# Patient Record
Sex: Male | Born: 1980 | Hispanic: No | Marital: Single | Smoking: Current every day smoker
Health system: Southern US, Community
[De-identification: ages and names within clinical notes are randomized; demographics above are authoritative.]

## PROBLEM LIST (undated history)

## (undated) DIAGNOSIS — B20 Human immunodeficiency virus [HIV] disease: Secondary | ICD-10-CM

## (undated) DIAGNOSIS — F319 Bipolar disorder, unspecified: Secondary | ICD-10-CM

## (undated) DIAGNOSIS — F603 Borderline personality disorder: Secondary | ICD-10-CM

## (undated) DIAGNOSIS — J45909 Unspecified asthma, uncomplicated: Secondary | ICD-10-CM

## (undated) DIAGNOSIS — B1920 Unspecified viral hepatitis C without hepatic coma: Secondary | ICD-10-CM

## (undated) HISTORY — DX: Unspecified viral hepatitis C without hepatic coma: B19.20

## (undated) HISTORY — DX: Bipolar disorder, unspecified: F31.9

## (undated) HISTORY — PX: OTHER SURGICAL HISTORY: SHX169

## (undated) HISTORY — DX: Borderline personality disorder: F60.3

## (undated) HISTORY — DX: Unspecified asthma, uncomplicated: J45.909

---

## 1999-04-24 DIAGNOSIS — Z21 Asymptomatic human immunodeficiency virus [HIV] infection status: Secondary | ICD-10-CM | POA: Insufficient documentation

## 1999-04-24 DIAGNOSIS — B20 Human immunodeficiency virus [HIV] disease: Secondary | ICD-10-CM | POA: Insufficient documentation

## 2009-04-23 DIAGNOSIS — B20 Human immunodeficiency virus [HIV] disease: Secondary | ICD-10-CM | POA: Insufficient documentation

## 2019-01-29 DIAGNOSIS — Z720 Tobacco use: Secondary | ICD-10-CM | POA: Insufficient documentation

## 2019-09-24 DIAGNOSIS — R2231 Localized swelling, mass and lump, right upper limb: Secondary | ICD-10-CM | POA: Insufficient documentation

## 2019-10-12 DIAGNOSIS — M67441 Ganglion, right hand: Secondary | ICD-10-CM | POA: Insufficient documentation

## 2019-10-12 DIAGNOSIS — K219 Gastro-esophageal reflux disease without esophagitis: Secondary | ICD-10-CM | POA: Insufficient documentation

## 2020-12-29 ENCOUNTER — Ambulatory Visit: Payer: Self-pay

## 2020-12-29 ENCOUNTER — Other Ambulatory Visit: Payer: Self-pay

## 2020-12-29 DIAGNOSIS — Z113 Encounter for screening for infections with a predominantly sexual mode of transmission: Secondary | ICD-10-CM

## 2020-12-29 DIAGNOSIS — B20 Human immunodeficiency virus [HIV] disease: Secondary | ICD-10-CM

## 2020-12-29 DIAGNOSIS — Z79899 Other long term (current) drug therapy: Secondary | ICD-10-CM

## 2021-01-02 ENCOUNTER — Other Ambulatory Visit (HOSPITAL_COMMUNITY)
Admission: RE | Admit: 2021-01-02 | Discharge: 2021-01-02 | Disposition: A | Discharge status: Home / Self Care | Payer: Medicare Other | Source: Ambulatory Visit | Attending: Infectious Disease | Admitting: Infectious Disease

## 2021-01-02 ENCOUNTER — Other Ambulatory Visit: Payer: Medicare Other

## 2021-01-02 ENCOUNTER — Other Ambulatory Visit: Payer: Self-pay

## 2021-01-02 ENCOUNTER — Ambulatory Visit: Payer: Self-pay

## 2021-01-02 DIAGNOSIS — Z79899 Other long term (current) drug therapy: Secondary | ICD-10-CM

## 2021-01-02 DIAGNOSIS — Z113 Encounter for screening for infections with a predominantly sexual mode of transmission: Secondary | ICD-10-CM

## 2021-01-02 DIAGNOSIS — B20 Human immunodeficiency virus [HIV] disease: Secondary | ICD-10-CM | POA: Insufficient documentation

## 2021-01-03 LAB — URINALYSIS
Bilirubin Urine: NEGATIVE
Glucose, UA: NEGATIVE
Hgb urine dipstick: NEGATIVE
Ketones, ur: NEGATIVE
Leukocytes,Ua: NEGATIVE
Nitrite: NEGATIVE
Protein, ur: NEGATIVE
Specific Gravity, Urine: 1.022 (ref 1.001–1.035)
pH: 7.5 (ref 5.0–8.0)

## 2021-01-03 LAB — URINE CYTOLOGY ANCILLARY ONLY
Chlamydia: NEGATIVE
Comment: NEGATIVE
Comment: NORMAL
Neisseria Gonorrhea: NEGATIVE

## 2021-01-03 LAB — T-HELPER CELL (CD4) - (RCID CLINIC ONLY)
CD4 % Helper T Cell: 26 % — ABNORMAL LOW (ref 33–65)
CD4 T Cell Abs: 499 /uL (ref 400–1790)

## 2021-01-06 LAB — COMPLETE METABOLIC PANEL WITH GFR
AG Ratio: 1.5 (calc) (ref 1.0–2.5)
ALT: 15 U/L (ref 9–46)
AST: 21 U/L (ref 10–40)
Albumin: 4.4 g/dL (ref 3.6–5.1)
Alkaline phosphatase (APISO): 44 U/L (ref 36–130)
BUN/Creatinine Ratio: 12 (calc) (ref 6–22)
BUN: 16 mg/dL (ref 7–25)
CO2: 26 mmol/L (ref 20–32)
Calcium: 9.3 mg/dL (ref 8.6–10.3)
Chloride: 104 mmol/L (ref 98–110)
Creat: 1.32 mg/dL — ABNORMAL HIGH (ref 0.60–1.29)
Globulin: 2.9 g/dL (calc) (ref 1.9–3.7)
Glucose, Bld: 82 mg/dL (ref 65–99)
Potassium: 4.6 mmol/L (ref 3.5–5.3)
Sodium: 139 mmol/L (ref 135–146)
Total Bilirubin: 0.3 mg/dL (ref 0.2–1.2)
Total Protein: 7.3 g/dL (ref 6.1–8.1)
eGFR: 69 mL/min/{1.73_m2} (ref >=60)

## 2021-01-06 LAB — HEPATITIS B CORE ANTIBODY, TOTAL: Hep B Core Total Ab: NONREACTIVE

## 2021-01-06 LAB — CBC WITH DIFFERENTIAL/PLATELET
Absolute Monocytes: 571 cells/uL (ref 200–950)
Basophils Absolute: 48 cells/uL (ref 0–200)
Basophils Relative: 0.7 %
Eosinophils Absolute: 48 cells/uL (ref 15–500)
Eosinophils Relative: 0.7 %
HCT: 49.9 % (ref 38.5–50.0)
Hemoglobin: 16 g/dL (ref 13.2–17.1)
Lymphs Abs: 2190 cells/uL (ref 850–3900)
MCH: 27.3 pg (ref 27.0–33.0)
MCHC: 32.1 g/dL (ref 32.0–36.0)
MCV: 85.2 fL (ref 80.0–100.0)
MPV: 9.8 fL (ref 7.5–12.5)
Monocytes Relative: 8.4 %
Neutro Abs: 3944 cells/uL (ref 1500–7800)
Neutrophils Relative %: 58 %
Platelets: 317 10*3/uL (ref 140–400)
RBC: 5.86 10*6/uL — ABNORMAL HIGH (ref 4.20–5.80)
RDW: 13.1 % (ref 11.0–15.0)
Total Lymphocyte: 32.2 %
WBC: 6.8 10*3/uL (ref 3.8–10.8)

## 2021-01-06 LAB — HLA B*5701: HLA-B*5701 w/rflx HLA-B High: NEGATIVE

## 2021-01-06 LAB — LIPID PANEL
Cholesterol: 153 mg/dL (ref <200)
HDL: 48 mg/dL (ref >=40)
LDL Cholesterol (Calc): 91 mg/dL (calc)
Non-HDL Cholesterol (Calc): 105 mg/dL (calc) (ref <130)
Total CHOL/HDL Ratio: 3.2 (calc) (ref <5.0)
Triglycerides: 54 mg/dL (ref <150)

## 2021-01-06 LAB — HEPATITIS B SURFACE ANTIBODY,QUALITATIVE: Hep B S Ab: REACTIVE — AB

## 2021-01-06 LAB — HIV-1/2 AB - DIFFERENTIATION
HIV-1 antibody: POSITIVE — AB
HIV-2 Ab: NEGATIVE

## 2021-01-06 LAB — HEPATITIS A ANTIBODY, TOTAL: Hepatitis A AB,Total: NONREACTIVE

## 2021-01-06 LAB — HEPATITIS C ANTIBODY
Hepatitis C Ab: REACTIVE — AB
SIGNAL TO CUT-OFF: 15.4 — ABNORMAL HIGH (ref <1.00)

## 2021-01-06 LAB — QUANTIFERON-TB GOLD PLUS
Mitogen-NIL: >10 IU/mL
NIL: 0.07 IU/mL
QuantiFERON-TB Gold Plus: NEGATIVE
TB1-NIL: 0 IU/mL
TB2-NIL: <0 IU/mL

## 2021-01-06 LAB — HIV ANTIBODY (ROUTINE TESTING W REFLEX): HIV 1&2 Ab, 4th Generation: REACTIVE — AB

## 2021-01-06 LAB — HCV RNA,QUANTITATIVE REAL TIME PCR
HCV Quantitative Log: <1.18 Log IU/mL
HCV RNA, PCR, QN: <15 IU/mL

## 2021-01-06 LAB — HIV-1 RNA ULTRAQUANT REFLEX TO GENTYP+
HIV 1 RNA Quant: <20 copies/mL — AB
HIV-1 RNA Quant, Log: <1.3 Log copies/mL — AB

## 2021-01-06 LAB — RPR: RPR Ser Ql: NONREACTIVE

## 2021-01-06 LAB — HEPATITIS B SURFACE ANTIGEN: Hepatitis B Surface Ag: NONREACTIVE

## 2021-01-13 ENCOUNTER — Ambulatory Visit: Payer: Self-pay | Admitting: Pharmacist

## 2021-01-13 ENCOUNTER — Encounter: Payer: Self-pay | Admitting: Infectious Disease

## 2021-01-16 ENCOUNTER — Ambulatory Visit: Payer: Self-pay | Admitting: Pharmacist

## 2021-01-16 ENCOUNTER — Other Ambulatory Visit (HOSPITAL_COMMUNITY): Payer: Self-pay

## 2021-01-16 ENCOUNTER — Telehealth: Payer: Self-pay

## 2021-01-16 ENCOUNTER — Encounter: Payer: Self-pay | Admitting: Infectious Disease

## 2021-01-16 NOTE — Telephone Encounter (Signed)
[facility] Patient Teacher, English as a foreign language completed.    The patient is insured through [employer].  Phillips Odor was last filled 12/28/20 NF 01/20/21  Kern Reap is covered under pharmacy benefits prescription can be filled at [facility].  We will continue to follow to see if copay assistance is needed.  Ileene Patrick, [facility] Patient [facility] Phone: [phone] Fax:  [phone]

## 2021-01-24 ENCOUNTER — Encounter: Payer: Self-pay | Admitting: Infectious Disease

## 2021-01-24 ENCOUNTER — Ambulatory Visit: Payer: Self-pay | Admitting: Pharmacist

## 2021-01-26 NOTE — Progress Notes (Signed)
Patient ID: Justin Hodge, male   DOB: December 27, 1980, 40 y.o.   MRN: [id number] Patient [name] to reschedule his missed appointment on 01-24-21  he has no showed 2 other times  advised would need to reschedule with Pharmacy  he was coughing and sneezing said he felt nauseas  ask if he has had a covid test recently he advised he did not need one and he wanted a Freight forwarder  tried to transfer to [place] and got her voice mail

## 2021-01-30 ENCOUNTER — Encounter: Payer: Self-pay | Admitting: Family

## 2021-01-30 ENCOUNTER — Other Ambulatory Visit: Payer: Self-pay

## 2021-01-30 ENCOUNTER — Ambulatory Visit (INDEPENDENT_AMBULATORY_CARE_PROVIDER_SITE_OTHER): Payer: Medicare Other | Admitting: Family

## 2021-01-30 VITALS — BP 143/79 | HR 81 | Temp 97.9°F | Wt 178.0 lb

## 2021-01-30 DIAGNOSIS — B20 Human immunodeficiency virus [HIV] disease: Secondary | ICD-10-CM

## 2021-01-30 DIAGNOSIS — M6740 Ganglion, unspecified site: Secondary | ICD-10-CM | POA: Diagnosis not present

## 2021-01-30 DIAGNOSIS — F319 Bipolar disorder, unspecified: Secondary | ICD-10-CM | POA: Diagnosis not present

## 2021-01-30 DIAGNOSIS — Z3141 Encounter for fertility testing: Secondary | ICD-10-CM | POA: Diagnosis not present

## 2021-01-30 DIAGNOSIS — F3160 Bipolar disorder, current episode mixed, unspecified: Secondary | ICD-10-CM | POA: Insufficient documentation

## 2021-01-30 MED ORDER — BIKTARVY 50-200-25 MG PO TABS: 1.0000 | ORAL_TABLET | Freq: Every day | ORAL | 4 refills | Status: DC

## 2021-01-30 NOTE — Assessment & Plan Note (Signed)
Mr. Lagrand is interested in having children in the future and was previously told that he may have issues with his sperm count affecting his ability to reproduce.  Referral to urology placed per his request.

## 2021-01-30 NOTE — Progress Notes (Signed)
Brief Narrative   Patient ID: Justin Hodge, male    DOB: 1980-06-24, 40 y.o.   MRN: [id number]  Justin Hodge is a 40 year old mixed race male diagnosed with HIV in 2001 with risk factor of MSM.  Initial viral load and CD4 count are unavailable.  Initial clinic lab work with CD4 count of 499 and viral load undetectable.  No genotype performed.  Has had history of thrush with no other significant opportunistic infections.  Previous history of hepatitis C status posttreatment. [id number] negative.  Previous medication regimen include Atripla, Tivicay, Truvada, and Descovy.  Subjective:    Chief Complaint  Patient presents with   HIV Positive/AIDS    HPI:  Justin Hodge is a 40 y.o. male with HIV disease presenting today to establish/transfer care.  Initial clinic lab work completed in 01/02/2021 with viral load that was undetectable and CD4 count of 499.  Hepatitis C antibody was reactive with no viral load detected.  [id number] negative. Immune to hepatitis B.  Negative for STI including gonorrhea, chlamydia, and syphilis.  Kidney function, liver function, electrolytes within normal ranges.  Justin Hodge was diagnosed with HIV disease in 2001 with risk factor of MSM.  Initial viral load and CD4 count unavailable.  Has had thrush in the past with no other significant opportunistic infection.  Previous ART regimens have included Atripla, Tivicay, Truvada, Descovy, and now Hodge.  He moved to [place] from [place]. Single with a partner who is HIV positive and currently in care. Working full time for [employer] as a Education officer, community. No problems with access to food and has stable housing.  Justin Hodge continues to take his Biktarvy daily as prescribed with no adverse side effects.  Overall feeling well today.  He has 2 areas of concern.  The first being a lesion/cyst located on his right medial wrist that has been going on for several years and has worsened recently.  Previously was going to  see an orthopedic surgeon but was unable to do so secondary to timing.  Now with pain when he comes in contact with that area and would like to take the neck steps for possible treatment.  Secondly he has bipolar 1 and borderline personality disorder which is currently managed with lithium.  He is requesting referral to psychiatry for continued care.  Lastly considering family-planning in the future and was told he may have issues with sperm count and would like to see a urologist. Denies fevers, chills, night sweats, headaches, changes in vision, neck pain/stiffness, nausea, diarrhea, vomiting, lesions or rashes.   No Known Allergies    Outpatient Medications Prior to Visit  Medication Sig Dispense Refill   lithium 600 MG capsule Take 600 mg by mouth 2 (two) times daily.     VENTOLIN HFA 108 (90 Base) MCG/ACT inhaler Inhale into the lungs.     BIKTARVY 50-200-25 MG TABS tablet Take 1 tablet by mouth daily.     No facility-administered medications prior to visit.     Past Medical History:  Diagnosis Date   Asthma    Bipolar 1 disorder, depressed ([place])    Borderline personality disorder ([place])    Hepatitis C    s/p treatment 2018     Past Surgical History:  Procedure Laterality Date   Bile obstruction     Temporary Colostomy s/p reversal      Review of Systems  Constitutional:  Negative for appetite change, chills, fatigue, fever and unexpected weight change.  Eyes:  Negative  for visual disturbance.  Respiratory:  Negative for cough, chest tightness, shortness of breath and wheezing.   Cardiovascular:  Negative for chest pain and leg swelling.  Gastrointestinal:  Negative for abdominal pain, constipation, diarrhea, nausea and vomiting.  Genitourinary:  Negative for dysuria, flank pain, frequency, genital sores, hematuria and urgency.  Musculoskeletal:        Positive wrist lesion  Skin:  Negative for rash.  Allergic/Immunologic: Negative for immunocompromised state.   Neurological:  Negative for dizziness and headaches.     Objective:    BP (!) 143/79   Pulse 81   Temp 97.9 F (36.6 C) (Oral)   Wt 178 lb (80.7 kg)   SpO2 97%  Nursing note and vital signs reviewed.  Physical Exam Constitutional:      General: He is not in acute distress.    Appearance: He is well-developed.  Eyes:     Conjunctiva/sclera: Conjunctivae normal.  Cardiovascular:     Rate and Rhythm: Normal rate and regular rhythm.     Heart sounds: Normal heart sounds. No murmur heard.   No friction rub. No gallop.  Pulmonary:     Effort: Pulmonary effort is normal. No respiratory distress.     Breath sounds: Normal breath sounds. No wheezing or rales.  Chest:     Chest wall: No tenderness.  Abdominal:     General: Bowel sounds are normal.     Palpations: Abdomen is soft.     Tenderness: There is no abdominal tenderness.  Musculoskeletal:     Cervical back: Neck supple.     Comments: Right medial wrist with pea sized lesion/cyst that is tender to the touch.   Lymphadenopathy:     Cervical: No cervical adenopathy.  Skin:    General: Skin is warm and dry.     Findings: No rash.  Neurological:     Mental Status: He is alert and oriented to person, place, and time.  Psychiatric:        Behavior: Behavior normal.        Thought Content: Thought content normal.        Judgment: Judgment normal.     Depression screen PHQ 2/9 01/30/2021  Decreased Interest 1  Down, Depressed, Hopeless 1  PHQ - 2 Score 2  Altered sleeping 1  Tired, decreased energy 1  Change in appetite 1  Feeling bad or failure about yourself  1  Trouble concentrating 0  Moving slowly or fidgety/restless 0  Suicidal thoughts 0  PHQ-9 Score 6       Assessment & Plan:    Patient Active Problem List   Diagnosis Date Noted   HIV disease ([place]) 01/30/2021   Ganglion cyst 01/30/2021   Bipolar 1 disorder ([place]) 01/30/2021   Encounter for sperm count for fertility testing 01/30/2021     Problem  List Items Addressed This Visit       Other   HIV disease ([place]) - Primary    Justin Hodge has well controlled virus with good adherence and tolerance to his ART regimen of Biktarvy.  No signs/symptoms of opportunistic infection.  We reviewed initial clinic lab work and discussed plan of care.  Continue current dose of Biktarvy.  Plan for follow-up in 3 months or sooner if needed with lab work 1 to 2 weeks prior to appointment.      Relevant Medications   BIKTARVY 50-200-25 MG TABS tablet   Ganglion cyst    Justin Hodge signs/symptoms of wrist lesion are consistent  with ganglion cyst.  Does not appear to be infectious at this point as it has been a chronic lesion.  Refer to orthopedics for further evaluation and treatment.      Relevant Orders   Ambulatory referral to Orthopedics   Bipolar 1 disorder [facility])    Justin Hodge was previously diagnosed with bipolar 1 disorder and borderline personality disorder and currently maintained on lithium.  Referral placed to psychiatry for continued treatment with lithium.  Resources of local psychiatric clinic provided in after visit summary if needed.      Relevant Orders   Ambulatory referral to Psychiatry   Encounter for sperm count for fertility testing    Justin Hodge is interested in having children in the future and was previously told that he may have issues with his sperm count affecting his ability to reproduce.  Referral to urology placed per his request.      Relevant Orders   Ambulatory referral to Urology     I am having Justin Hodge maintain his Ventolin HFA, lithium, and Biktarvy.   Meds ordered this encounter  Medications   BIKTARVY 50-200-25 MG TABS tablet    Sig: Take 1 tablet by mouth daily.    Dispense:  30 tablet    Refill:  4    Do not fill until refill requested.    Order Specific Question:   Supervising Provider    Answer:   Carlyle Basques [[id number]]     Follow-up: Return in about 3 months (around  05/02/2021), or if symptoms worsen or fail to improve.   Terri Piedra, MSN, FNP-C Nurse Practitioner [facility] for Infectious Disease [place] number: [phone]

## 2021-01-30 NOTE — Patient Instructions (Addendum)
Nice to see you.  We will check your lab work today.  Continue to take your medication daily as prescribed.  Refills have been sent to the pharmacy.  [facility]   [address] [place], [place] [address] [phone]  Referrals to orthopedics and psych have been sent.   Plan for follow up in 1 month or sooner if needed with lab work 1-2 weeks prior to appointment.   Have a great day and stay safe!

## 2021-01-30 NOTE — Assessment & Plan Note (Signed)
Mr. Swetz was previously diagnosed with bipolar 1 disorder and borderline personality disorder and currently maintained on lithium.  Referral placed to psychiatry for continued treatment with lithium.  Resources of local psychiatric clinic provided in after visit summary if needed.

## 2021-01-30 NOTE — Assessment & Plan Note (Signed)
Mr. Wallen's signs/symptoms of wrist lesion are consistent with ganglion cyst.  Does not appear to be infectious at this point as it has been a chronic lesion.  Refer to orthopedics for further evaluation and treatment.

## 2021-01-30 NOTE — Assessment & Plan Note (Signed)
Mr. Dahlstrom has well controlled virus with good adherence and tolerance to his ART regimen of Biktarvy.  No signs/symptoms of opportunistic infection.  We reviewed initial clinic lab work and discussed plan of care.  Continue current dose of Biktarvy.  Plan for follow-up in 3 months or sooner if needed with lab work 1 to 2 weeks prior to appointment.

## 2021-02-02 ENCOUNTER — Other Ambulatory Visit (HOSPITAL_COMMUNITY)
Admission: RE | Admit: 2021-02-02 | Discharge: 2021-02-02 | Disposition: A | Discharge status: Home / Self Care | Payer: Medicare Other | Source: Ambulatory Visit | Attending: Family | Admitting: Family

## 2021-02-02 ENCOUNTER — Other Ambulatory Visit: Payer: Self-pay

## 2021-02-02 ENCOUNTER — Encounter: Payer: Self-pay | Admitting: Family

## 2021-02-02 ENCOUNTER — Ambulatory Visit (INDEPENDENT_AMBULATORY_CARE_PROVIDER_SITE_OTHER): Payer: Medicare Other | Admitting: Family

## 2021-02-02 VITALS — BP 139/80 | HR 77 | Temp 97.9°F | Wt 176.6 lb

## 2021-02-02 DIAGNOSIS — R369 Urethral discharge, unspecified: Secondary | ICD-10-CM | POA: Insufficient documentation

## 2021-02-02 DIAGNOSIS — F319 Bipolar disorder, unspecified: Secondary | ICD-10-CM

## 2021-02-02 MED ORDER — DOXYCYCLINE HYCLATE 100 MG PO TABS: 100.0000 mg | ORAL_TABLET | Freq: Two times a day (BID) | ORAL | 0 refills | Status: DC

## 2021-02-02 MED ORDER — CEFTRIAXONE SODIUM 500 MG IJ SOLR
500.0000 mg | Freq: Once | INTRAMUSCULAR | Status: AC
  Administered 2021-02-02: 500 mg via INTRAMUSCULAR

## 2021-02-02 NOTE — Patient Instructions (Signed)
Nice to see you.  We will check your lab work today.  Additional treatment pending lab work results.   Have a great day and stay safe!

## 2021-02-02 NOTE — Assessment & Plan Note (Signed)
Mr. Justin Hodge has new acute onset penile discharge concerning for STD infection. Check labs. Discussed empirically treating for gonorrhea and chlamydia. RPR negative just about 3 weeks ago and will recheck. Treat with ceftriaxone 500 mg IM once and doxycycline 100 mg PO bid x 7 days. Discussed importance of safe sexual practice and condom usage. Additional treatment pending lab work results as needed.

## 2021-02-02 NOTE — Assessment & Plan Note (Signed)
Check lithium levels. Will prescribe 1 month of lithium until psych provider can be established.

## 2021-02-02 NOTE — Addendum Note (Signed)
Addended by: Leatrice Jewels on: 02/02/2021 11:15 AM   Modules accepted: Orders

## 2021-02-02 NOTE — Progress Notes (Signed)
Patient ID: Justin Hodge, male    DOB: 30-Sep-1980, 40 y.o.   MRN: [id number]   Subjective:    Chief Complaint  Patient presents with   Follow-up    Penile irritation, burning and discharge started four days ago     HPI:  Justin Hodge is a 40 y.o. male with HIV disease presents today for an acute office visit.   Justin Hodge has been having penile irritation, burning and discharge that started approximately 4 days ago and worsening since initial onset. Discharge described as brownish. Has not had any new sexual partners and remains with the same partner.  Denies fevers, chills, night sweats, headaches, changes in vision, neck pain/stiffness, nausea, diarrhea, vomiting, lesions or rashes.  No Known Allergies    Outpatient Medications Prior to Visit  Medication Sig Dispense Refill   BIKTARVY 50-200-25 MG TABS tablet Take 1 tablet by mouth daily. 30 tablet 4   lithium 600 MG capsule Take 600 mg by mouth 2 (two) times daily.     VENTOLIN HFA 108 (90 Base) MCG/ACT inhaler Inhale into the lungs.     No facility-administered medications prior to visit.     Past Medical History:  Diagnosis Date   Asthma    Bipolar 1 disorder, depressed ([place])    Borderline personality disorder ([place])    Hepatitis C    s/p treatment 2018     Past Surgical History:  Procedure Laterality Date   Bile obstruction     Temporary Colostomy s/p reversal      Review of Systems  Constitutional:  Negative for appetite change, chills, fatigue, fever and unexpected weight change.  Eyes:  Negative for visual disturbance.  Respiratory:  Negative for cough, chest tightness, shortness of breath and wheezing.   Cardiovascular:  Negative for chest pain and leg swelling.  Gastrointestinal:  Negative for abdominal pain, constipation, diarrhea, nausea and vomiting.  Genitourinary:  Positive for dysuria and penile discharge. Negative for flank pain, frequency, genital sores, hematuria and urgency.   Skin:  Negative for rash.  Allergic/Immunologic: Negative for immunocompromised state.  Neurological:  Negative for dizziness and headaches.     Objective:    BP 139/80   Pulse 77   Temp 97.9 F (36.6 C) (Oral)   Wt 176 lb 9.6 oz (80.1 kg)   SpO2 100%  Nursing note and vital signs reviewed.  Physical Exam Constitutional:      General: He is not in acute distress.    Appearance: He is well-developed.  Cardiovascular:     Rate and Rhythm: Normal rate and regular rhythm.     Heart sounds: Normal heart sounds.  Pulmonary:     Effort: Pulmonary effort is normal.     Breath sounds: Normal breath sounds.  Skin:    General: Skin is warm and dry.  Neurological:     Mental Status: He is alert and oriented to person, place, and time.  Psychiatric:        Mood and Affect: Mood normal.     Depression screen PHQ 2/9 01/30/2021  Decreased Interest 1  Down, Depressed, Hopeless 1  PHQ - 2 Score 2  Altered sleeping 1  Tired, decreased energy 1  Change in appetite 1  Feeling bad or failure about yourself  1  Trouble concentrating 0  Moving slowly or fidgety/restless 0  Suicidal thoughts 0  PHQ-9 Score 6       Assessment & Plan:    Patient Active Problem List  Diagnosis Date Noted   Penile discharge 02/02/2021   HIV disease ([place]) 01/30/2021   Ganglion cyst 01/30/2021   Bipolar 1 disorder ([place]) 01/30/2021   Encounter for sperm count for fertility testing 01/30/2021     Problem List Items Addressed This Visit       Other   Bipolar 1 disorder ([place])    Check lithium levels. Will prescribe 1 month of lithium until psych provider can be established.      Relevant Orders   Lithium level   Penile discharge - Primary    Justin Hodge has new acute onset penile discharge concerning for STD infection. Check labs. Discussed empirically treating for gonorrhea and chlamydia. RPR negative just about 3 weeks ago and will recheck. Treat with ceftriaxone 500 mg IM once and  doxycycline 100 mg PO bid x 7 days. Discussed importance of safe sexual practice and condom usage. Additional treatment pending lab work results as needed.        Relevant Medications   doxycycline (VIBRA-TABS) 100 MG tablet   Other Relevant Orders   RPR   Urine cytology ancillary only   Cytology (oral, anal, urethral) ancillary only   Cytology (oral, anal, urethral) ancillary only     I am having Justin Hodge start on doxycycline. I am also having him maintain his Ventolin HFA, lithium, and Biktarvy. We administered cefTRIAXone.   Meds ordered this encounter  Medications   doxycycline (VIBRA-TABS) 100 MG tablet    Sig: Take 1 tablet (100 mg total) by mouth 2 (two) times daily.    Dispense:  20 tablet    Refill:  0    Order Specific Question:   Supervising Provider    Answer:   Baxter Flattery, CYNTHIA [[id number]]   cefTRIAXone (ROCEPHIN) injection 500 mg     Follow-up: Return if symptoms worsen or fail to improve.   Terri Piedra, MSN, FNP-C Nurse Practitioner [facility] for Infectious Disease [place] number: [phone]

## 2021-02-03 LAB — CYTOLOGY, (ORAL, ANAL, URETHRAL) ANCILLARY ONLY
Chlamydia: NEGATIVE
Chlamydia: NEGATIVE
Comment: NEGATIVE
Comment: NEGATIVE
Comment: NORMAL
Comment: NORMAL
Neisseria Gonorrhea: NEGATIVE
Neisseria Gonorrhea: NEGATIVE

## 2021-02-03 LAB — URINE CYTOLOGY ANCILLARY ONLY
Chlamydia: NEGATIVE
Comment: NEGATIVE
Comment: NORMAL
Neisseria Gonorrhea: NEGATIVE

## 2021-02-06 ENCOUNTER — Telehealth: Payer: Self-pay

## 2021-02-06 ENCOUNTER — Ambulatory Visit (INDEPENDENT_AMBULATORY_CARE_PROVIDER_SITE_OTHER): Payer: Medicare Other

## 2021-02-06 ENCOUNTER — Other Ambulatory Visit: Payer: Self-pay

## 2021-02-06 DIAGNOSIS — A539 Syphilis, unspecified: Secondary | ICD-10-CM

## 2021-02-06 LAB — LITHIUM LEVEL: Lithium Lvl: <0.3 mmol/L — ABNORMAL LOW (ref 0.6–1.2)

## 2021-02-06 LAB — RPR TITER: RPR Titer: 1:2 {titer} — ABNORMAL HIGH

## 2021-02-06 LAB — RPR: RPR Ser Ql: REACTIVE — AB

## 2021-02-06 LAB — FLUORESCENT TREPONEMAL AB(FTA)-IGG-BLD: Fluorescent Treponemal ABS: REACTIVE — AB

## 2021-02-06 MED ORDER — PENICILLIN G BENZATHINE 1200000 UNIT/2ML IM SUSY
1.2000 10*6.[IU] | PREFILLED_SYRINGE | Freq: Once | INTRAMUSCULAR | Status: AC
  Administered 2021-02-06: 1.2 10*6.[IU] via INTRAMUSCULAR

## 2021-02-06 NOTE — Telephone Encounter (Signed)
Patient called office today after noticing pos RPR in mychart. Patient would like to schedule appointment for today for treatment. Patient had had previous treatment for syphillis. Denies any allergies to penicillin.  Scheduled to come in today at 2 pm for treatment.  Patient will have partner call office for STD testing /treatment.  Leatrice Jewels, RMA

## 2021-02-06 NOTE — Telephone Encounter (Signed)
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 States that he spoke with his partner who denies having any sexual activity with any one else. Patient would like to know if infection was dormant or result of insufficient treatment from previous exposure.  Spoke with Elna Breslow, [place] who states that since patient has had a neg RPR in September and pos PRP in October that this is a new infection.  Not able to reach patient back to relay this message.  Leatrice Jewels, RMA

## 2021-02-14 ENCOUNTER — Telehealth: Payer: Self-pay

## 2021-02-14 NOTE — Telephone Encounter (Signed)
Received call from Vertis Kelch with [facility] wanting to confirm RPR results. She states patient told her that he is moving to [place].   Beryle Flock, RN

## 2021-02-15 NOTE — Telephone Encounter (Signed)
Spoke with patient, he is very concerned about his RPR titer. He says he was reactive 01/2020 and that he was treated with Bicillin x 1 at that point.  He then received a call from the HD in [place] telling him that his titer had gone up to 1:4. He was told he did not need treatment at that time. Results are available in care everywhere.   The patient states that he has only had sex with his boyfriend and all of his RPR titers have been non-reactive. He was told that his titer of 1:2 this month indicates a new infection   He is adamant that he has not had sex with anyone else and would like to know if his current titer of 1:2 could be due to inadequate treatment, or if it truly represent a new infection. Will route to provider.   RPR History:  01/2020: reactive per patient (no titer available) 08/2020: reactive 1:4 12/2020: non reactive 01/2021: reactive 1:2  Beryle Flock, RN

## 2021-03-27 ENCOUNTER — Encounter: Payer: Self-pay | Admitting: Family

## 2021-03-27 ENCOUNTER — Ambulatory Visit (INDEPENDENT_AMBULATORY_CARE_PROVIDER_SITE_OTHER): Payer: Medicare Other | Admitting: Family

## 2021-03-27 ENCOUNTER — Other Ambulatory Visit (HOSPITAL_COMMUNITY)
Admission: RE | Admit: 2021-03-27 | Discharge: 2021-03-27 | Disposition: A | Discharge status: Home / Self Care | Payer: Medicare Other | Source: Ambulatory Visit | Attending: Family | Admitting: Family

## 2021-03-27 ENCOUNTER — Other Ambulatory Visit: Payer: Self-pay

## 2021-03-27 VITALS — BP 115/78 | HR 80 | Temp 97.9°F | Wt 183.0 lb

## 2021-03-27 DIAGNOSIS — Z113 Encounter for screening for infections with a predominantly sexual mode of transmission: Secondary | ICD-10-CM

## 2021-03-27 DIAGNOSIS — B20 Human immunodeficiency virus [HIV] disease: Secondary | ICD-10-CM

## 2021-03-27 DIAGNOSIS — Z0000 Encounter for general adult medical examination without abnormal findings: Secondary | ICD-10-CM | POA: Diagnosis not present

## 2021-03-27 MED ORDER — BIKTARVY 50-200-25 MG PO TABS: 1.0000 | ORAL_TABLET | Freq: Every day | ORAL | 4 refills | Status: DC

## 2021-03-27 NOTE — Assessment & Plan Note (Signed)
Mr. Prasad continues have well-controlled virus with good adherence and tolerance to his ART regimen of Biktarvy.  No signs/symptoms of opportunistic infection.  Reviewed previous lab work and discussed plan of care.  Check blood work today.  Continue current dose of Biktarvy.  Plan for follow-up in 3 months or sooner if needed with lab work on the same day.

## 2021-03-27 NOTE — Patient Instructions (Addendum)
Nice to see you  Continue to take your medications as prescribed.   Refills have been sent to the pharmacy  [facility]  [address], [place], [place] [address] [phone]  [facility]  Dr Leanora Cover   Phone [phone] Faxing info to [phone]  Plan for follow up in 3 months or sooner if needed with lab work on the same day.  Have a great day and stay safe!

## 2021-03-27 NOTE — Assessment & Plan Note (Signed)
   Discussed importance of safe sexual practices and condom use.  Condoms offered.  Encouraged routine dental care

## 2021-03-27 NOTE — Progress Notes (Signed)
Brief Narrative   Patient ID: Justin Hodge, male    DOB: 10/22/1980, 40 y.o.   MRN: [id number]  Justin Hodge is a 40 year old mixed race male diagnosed with HIV in 2001 with risk factor of MSM.  Initial viral load and CD4 count are unavailable.  Initial clinic lab work with CD4 count of 499 and viral load undetectable.  No genotype performed.  Has had history of thrush with no other significant opportunistic infections.  Previous history of hepatitis C status posttreatment. [id number] negative.  Previous medication regimen include Atripla, Tivicay, Truvada, and Descovy.  Subjective:    Chief Complaint  Patient presents with   Follow-up    B20    HPI:  Justin Hodge is a 40 y.o. male with HIV disease last seen on 01/30/2021 for routine HIV care with well-controlled virus and good adherence and tolerance to his ART regimen of Biktarvy as he transfer care.  Viral load was undetectable with CD4 count of 499.  In the interim he was seen for concern for STD and found to have a positive RPR titer 1: 2 and was treated with doxycycline.  Here today for routine follow-up.  Justin Hodge continues to take his Biktarvy daily as prescribed.  Feeling well today with no new concerns/complaints.  Would like STI testing to confirm previous infection was cleared up. Denies fevers, chills, night sweats, headaches, changes in vision, neck pain/stiffness, nausea, diarrhea, vomiting, lesions or rashes.  Justin Hodge has no problems obtaining medication from the pharmacy remains covered by Medicare.  Denies feelings of being down, depressed, or hopeless recently.  He is concerned about able to get his lithium as he is awaiting psychiatric evaluation to establish care.  Continues to smoke marijuana with occasional/social alcohol consumption and no current tobacco use.  Awaiting referral to orthopedics for his wrist.   Allergies  Allergen Reactions   Shellfish Allergy Anaphylaxis and Rash    Swelling, throat  closes, has epipens   Fish Allergy    Other     Other reaction(s): Rhinitis (Runny Nose, Stuffy Nose, Sneezing)      Outpatient Medications Prior to Visit  Medication Sig Dispense Refill   lithium 600 MG capsule Take 600 mg by mouth 2 (two) times daily.     VENTOLIN HFA 108 (90 Base) MCG/ACT inhaler Inhale into the lungs.     BIKTARVY 50-200-25 MG TABS tablet Take 1 tablet by mouth daily. 30 tablet 4   doxycycline (VIBRA-TABS) 100 MG tablet Take 1 tablet (100 mg total) by mouth 2 (two) times daily. (Patient not taking: Reported on 03/27/2021) 20 tablet 0   No facility-administered medications prior to visit.     Past Medical History:  Diagnosis Date   Asthma    Bipolar 1 disorder, depressed ([place])    Borderline personality disorder ([place])    Hepatitis C    s/p treatment 2018     Past Surgical History:  Procedure Laterality Date   Bile obstruction     Temporary Colostomy s/p reversal      Review of Systems  Constitutional:  Negative for appetite change, chills, fatigue, fever and unexpected weight change.  Eyes:  Negative for visual disturbance.  Respiratory:  Negative for cough, chest tightness, shortness of breath and wheezing.   Cardiovascular:  Negative for chest pain and leg swelling.  Gastrointestinal:  Negative for abdominal pain, constipation, diarrhea, nausea and vomiting.  Genitourinary:  Negative for dysuria, flank pain, frequency, genital sores, hematuria and urgency.  Skin:  Negative for rash.  Allergic/Immunologic: Negative for immunocompromised state.  Neurological:  Negative for dizziness and headaches.     Objective:    BP 115/78 (BP Location: Left Arm)   Pulse 80   Temp 97.9 F (36.6 C) (Oral)   Wt 183 lb (83 kg)  Nursing note and vital signs reviewed.  Physical Exam Constitutional:      General: He is not in acute distress.    Appearance: He is well-developed.  Eyes:     Conjunctiva/sclera: Conjunctivae normal.  Cardiovascular:     Rate  and Rhythm: Normal rate and regular rhythm.     Heart sounds: Normal heart sounds. No murmur heard.   No friction rub. No gallop.  Pulmonary:     Effort: Pulmonary effort is normal. No respiratory distress.     Breath sounds: Normal breath sounds. No wheezing or rales.  Chest:     Chest wall: No tenderness.  Abdominal:     General: Bowel sounds are normal.     Palpations: Abdomen is soft.     Tenderness: There is no abdominal tenderness.  Musculoskeletal:     Cervical back: Neck supple.  Lymphadenopathy:     Cervical: No cervical adenopathy.  Skin:    General: Skin is warm and dry.     Findings: No rash.  Neurological:     Mental Status: He is alert and oriented to person, place, and time.  Psychiatric:        Behavior: Behavior normal.        Thought Content: Thought content normal.        Judgment: Judgment normal.     Depression screen [facility] 2/9 03/27/2021 01/30/2021  Decreased Interest 0 1  Down, Depressed, Hopeless 0 1  PHQ - 2 Score 0 2  Altered sleeping - 1  Tired, decreased energy - 1  Change in appetite - 1  Feeling bad or failure about yourself  - 1  Trouble concentrating - 0  Moving slowly or fidgety/restless - 0  Suicidal thoughts - 0  PHQ-9 Score - 6       Assessment & Plan:    Patient Active Problem List   Diagnosis Date Noted   Healthcare maintenance 03/27/2021   Penile discharge 02/02/2021   HIV disease ([place]) 01/30/2021   Ganglion cyst 01/30/2021   Bipolar 1 disorder ([place]) 01/30/2021   Encounter for sperm count for fertility testing 01/30/2021     Problem List Items Addressed This Visit       Other   HIV disease ([place]) - Primary    Justin Hodge continues have well-controlled virus with good adherence and tolerance to his ART regimen of Biktarvy.  No signs/symptoms of opportunistic infection.  Reviewed previous lab work and discussed plan of care.  Check blood work today.  Continue current dose of Biktarvy.  Plan for follow-up in 3 months or  sooner if needed with lab work on the same day.      Relevant Medications   BIKTARVY 50-200-25 MG TABS tablet   Other Relevant Orders   T-helper cell (CD4)- ([facility] clinic only)   HIV-1 RNA quant-no reflex-bld   COMPLETE METABOLIC PANEL WITH GFR   Healthcare maintenance    Discussed importance of safe sexual practices and condom use.  Condoms offered. Encouraged routine dental care      Other Visit Diagnoses     Screening for STDs (sexually transmitted diseases)       Relevant Orders   RPR   Cytology (  oral, anal, urethral) ancillary only   Cytology (oral, anal, urethral) ancillary only   Urine cytology ancillary only([place])        I have discontinued Justin Hodge's doxycycline. I am also having him maintain his Ventolin HFA, lithium, and Biktarvy.   Meds ordered this encounter  Medications   BIKTARVY 50-200-25 MG TABS tablet    Sig: Take 1 tablet by mouth daily.    Dispense:  30 tablet    Refill:  4    Do not fill until refill requested.    Order Specific Question:   Supervising Provider    Answer:   Carlyle Basques [[id number]]     Follow-up: Return in about 3 months (around 06/25/2021), or if symptoms worsen or fail to improve.   Terri Piedra, MSN, FNP-C Nurse Practitioner [facility] [place] number: [phone]

## 2021-03-28 LAB — URINE CYTOLOGY ANCILLARY ONLY
Chlamydia: NEGATIVE
Comment: NEGATIVE
Comment: NORMAL
Neisseria Gonorrhea: NEGATIVE

## 2021-03-28 LAB — CYTOLOGY, (ORAL, ANAL, URETHRAL) ANCILLARY ONLY
Chlamydia: NEGATIVE
Chlamydia: NEGATIVE
Comment: NEGATIVE
Comment: NEGATIVE
Comment: NORMAL
Comment: NORMAL
Neisseria Gonorrhea: NEGATIVE
Neisseria Gonorrhea: NEGATIVE

## 2021-03-28 LAB — T-HELPER CELL (CD4) - (RCID CLINIC ONLY)
CD4 % Helper T Cell: 29 % — ABNORMAL LOW (ref 33–65)
CD4 T Cell Abs: 617 /uL (ref 400–1790)

## 2021-03-30 LAB — COMPLETE METABOLIC PANEL WITH GFR
AG Ratio: 1.3 (calc) (ref 1.0–2.5)
ALT: 49 U/L — ABNORMAL HIGH (ref 9–46)
AST: 31 U/L (ref 10–40)
Albumin: 3.8 g/dL (ref 3.6–5.1)
Alkaline phosphatase (APISO): 57 U/L (ref 36–130)
BUN: 11 mg/dL (ref 7–25)
CO2: 25 mmol/L (ref 20–32)
Calcium: 8.9 mg/dL (ref 8.6–10.3)
Chloride: 104 mmol/L (ref 98–110)
Creat: 1.22 mg/dL (ref 0.60–1.29)
Globulin: 2.9 g/dL (calc) (ref 1.9–3.7)
Glucose, Bld: 71 mg/dL (ref 65–99)
Potassium: 4.3 mmol/L (ref 3.5–5.3)
Sodium: 138 mmol/L (ref 135–146)
Total Bilirubin: 0.3 mg/dL (ref 0.2–1.2)
Total Protein: 6.7 g/dL (ref 6.1–8.1)
eGFR: 76 mL/min/{1.73_m2} (ref >=60)

## 2021-03-30 LAB — HIV-1 RNA QUANT-NO REFLEX-BLD
HIV 1 RNA Quant: NOT DETECTED {copies}/mL
HIV-1 RNA Quant, Log: NOT DETECTED {Log_copies}/mL

## 2021-03-30 LAB — RPR TITER: RPR Titer: 1:1 {titer} — ABNORMAL HIGH

## 2021-03-30 LAB — FLUORESCENT TREPONEMAL AB(FTA)-IGG-BLD: Fluorescent Treponemal ABS: REACTIVE — AB

## 2021-03-30 LAB — SYPHILIS: RPR W/REFLEX TO RPR TITER AND TREPONEMAL ANTIBODIES, TRADITIONAL SCREENING AND DIAGNOSIS ALGORITHM: RPR Ser Ql: REACTIVE — AB

## 2021-04-11 ENCOUNTER — Other Ambulatory Visit: Payer: Self-pay | Admitting: Orthopedic Surgery

## 2021-04-11 DIAGNOSIS — M7130 Other bursal cyst, unspecified site: Secondary | ICD-10-CM

## 2021-04-26 ENCOUNTER — Ambulatory Visit
Admission: RE | Admit: 2021-04-26 | Discharge: 2021-04-26 | Disposition: A | Discharge status: Home / Self Care | Payer: Medicare Other | Source: Ambulatory Visit | Attending: Orthopedic Surgery | Admitting: Orthopedic Surgery

## 2021-04-26 ENCOUNTER — Encounter: Payer: Self-pay | Admitting: Family

## 2021-04-26 DIAGNOSIS — M7130 Other bursal cyst, unspecified site: Secondary | ICD-10-CM

## 2021-05-01 ENCOUNTER — Other Ambulatory Visit: Payer: Medicare Other

## 2021-05-08 ENCOUNTER — Telehealth: Payer: Self-pay | Admitting: Psychiatry

## 2021-05-15 ENCOUNTER — Encounter: Payer: Medicare Other | Admitting: Family

## 2021-06-05 ENCOUNTER — Encounter: Payer: Self-pay | Admitting: Psychiatry

## 2021-06-05 ENCOUNTER — Telehealth (INDEPENDENT_AMBULATORY_CARE_PROVIDER_SITE_OTHER): Payer: Medicare Other | Admitting: Psychiatry

## 2021-06-05 ENCOUNTER — Other Ambulatory Visit: Payer: Self-pay

## 2021-06-05 DIAGNOSIS — Z8659 Personal history of other mental and behavioral disorders: Secondary | ICD-10-CM | POA: Insufficient documentation

## 2021-06-05 DIAGNOSIS — J45909 Unspecified asthma, uncomplicated: Secondary | ICD-10-CM | POA: Insufficient documentation

## 2021-06-05 DIAGNOSIS — F1290 Cannabis use, unspecified, uncomplicated: Secondary | ICD-10-CM

## 2021-06-05 DIAGNOSIS — F411 Generalized anxiety disorder: Secondary | ICD-10-CM | POA: Insufficient documentation

## 2021-06-05 DIAGNOSIS — Z79899 Other long term (current) drug therapy: Secondary | ICD-10-CM | POA: Insufficient documentation

## 2021-06-05 DIAGNOSIS — Z9189 Other specified personal risk factors, not elsewhere classified: Secondary | ICD-10-CM | POA: Diagnosis not present

## 2021-06-05 DIAGNOSIS — F3131 Bipolar disorder, current episode depressed, mild: Secondary | ICD-10-CM
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
## 2021-06-27 ENCOUNTER — Ambulatory Visit: Payer: Medicare Other | Admitting: Psychiatry

## 2021-07-06 ENCOUNTER — Other Ambulatory Visit: Payer: Self-pay | Admitting: Orthopedic Surgery

## 2021-07-18 ENCOUNTER — Telehealth: Payer: Self-pay
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
## 2021-07-26 ENCOUNTER — Encounter (HOSPITAL_BASED_OUTPATIENT_CLINIC_OR_DEPARTMENT_OTHER): Payer: Self-pay | Admitting: Orthopedic Surgery

## 2021-07-26 ENCOUNTER — Other Ambulatory Visit: Payer: Self-pay

## 2021-08-03 NOTE — Progress Notes (Signed)

## 2021-08-07 ENCOUNTER — Encounter (HOSPITAL_BASED_OUTPATIENT_CLINIC_OR_DEPARTMENT_OTHER): Payer: Self-pay | Admitting: Orthopedic Surgery

## 2021-08-07 ENCOUNTER — Encounter (HOSPITAL_BASED_OUTPATIENT_CLINIC_OR_DEPARTMENT_OTHER)
Admission: RE | Disposition: A | Discharge status: Home / Self Care | Payer: Self-pay | Source: Home / Self Care | Attending: Orthopedic Surgery

## 2021-08-07 ENCOUNTER — Other Ambulatory Visit: Payer: Self-pay

## 2021-08-07 ENCOUNTER — Ambulatory Visit (HOSPITAL_BASED_OUTPATIENT_CLINIC_OR_DEPARTMENT_OTHER)
Admission: RE | Admit: 2021-08-07 | Discharge: 2021-08-07 | Disposition: A | Discharge status: Home / Self Care | Payer: Medicare Other | Attending: Orthopedic Surgery | Admitting: Orthopedic Surgery

## 2021-08-07 ENCOUNTER — Ambulatory Visit (HOSPITAL_BASED_OUTPATIENT_CLINIC_OR_DEPARTMENT_OTHER): Payer: Medicare Other | Admitting: Certified Registered"

## 2021-08-07 DIAGNOSIS — R2231 Localized swelling, mass and lump, right upper limb: Secondary | ICD-10-CM | POA: Diagnosis not present

## 2021-08-07 DIAGNOSIS — Z21 Asymptomatic human immunodeficiency virus [HIV] infection status: Secondary | ICD-10-CM | POA: Insufficient documentation

## 2021-08-07 DIAGNOSIS — Z79899 Other long term (current) drug therapy: Secondary | ICD-10-CM | POA: Diagnosis not present

## 2021-08-07 DIAGNOSIS — D239 Other benign neoplasm of skin, unspecified: Secondary | ICD-10-CM | POA: Insufficient documentation

## 2021-08-07 DIAGNOSIS — J45909 Unspecified asthma, uncomplicated: Secondary | ICD-10-CM | POA: Insufficient documentation

## 2021-08-07 DIAGNOSIS — Z87891 Personal history of nicotine dependence: Secondary | ICD-10-CM | POA: Diagnosis not present

## 2021-08-07 DIAGNOSIS — B1920 Unspecified viral hepatitis C without hepatic coma: Secondary | ICD-10-CM | POA: Diagnosis not present

## 2021-08-07 DIAGNOSIS — F319 Bipolar disorder, unspecified: Secondary | ICD-10-CM | POA: Insufficient documentation

## 2021-08-07 HISTORY — PX: GANGLION CYST EXCISION: SHX1691

## 2021-08-07 HISTORY — DX: Human immunodeficiency virus (HIV) disease: B20

## 2021-08-07 SURGERY — EXCISION, GANGLION CYST, WRIST
Anesthesia: General | Site: Wrist | Laterality: Right

## 2021-08-07 MED ORDER — ACETAMINOPHEN 10 MG/ML IV SOLN: 1000.0000 mg | Freq: Once | INTRAVENOUS | Status: DC | PRN

## 2021-08-07 MED ORDER — HYDROCODONE-ACETAMINOPHEN 5-325 MG PO TABS
ORAL_TABLET | ORAL | 0 refills | Status: DC
Start: 2021-08-07 — End: 2021-08-18

## 2021-08-07 MED ORDER — CEFAZOLIN SODIUM-DEXTROSE 2-4 GM/100ML-% IV SOLN
2.0000 g | INTRAVENOUS | Status: AC
  Administered 2021-08-07: 2 g via INTRAVENOUS

## 2021-08-07 MED ORDER — MIDAZOLAM HCL 2 MG/2ML IJ SOLN
INTRAMUSCULAR | Status: AC
  Filled 2021-08-07: qty 2

## 2021-08-07 MED ORDER — CEFAZOLIN SODIUM-DEXTROSE 2-4 GM/100ML-% IV SOLN
INTRAVENOUS | Status: AC
  Filled 2021-08-07: qty 100

## 2021-08-07 MED ORDER — FENTANYL CITRATE (PF) 100 MCG/2ML IJ SOLN
INTRAMUSCULAR | Status: AC
  Filled 2021-08-07: qty 2

## 2021-08-07 MED ORDER — MIDAZOLAM HCL 5 MG/5ML IJ SOLN
INTRAMUSCULAR | Status: DC | PRN
Start: 2021-08-07 — End: 2021-08-07
  Administered 2021-08-07: 2 mg via INTRAVENOUS

## 2021-08-07 MED ORDER — LIDOCAINE HCL (CARDIAC) PF 100 MG/5ML IV SOSY
PREFILLED_SYRINGE | INTRAVENOUS | Status: DC | PRN
  Administered 2021-08-07: 60 mg via INTRAVENOUS

## 2021-08-07 MED ORDER — FENTANYL CITRATE (PF) 100 MCG/2ML IJ SOLN: 25.0000 ug | INTRAMUSCULAR | Status: DC | PRN

## 2021-08-07 MED ORDER — PROPOFOL 10 MG/ML IV BOLUS
INTRAVENOUS | Status: DC | PRN
  Administered 2021-08-07: 200 mg via INTRAVENOUS

## 2021-08-07 MED ORDER — LACTATED RINGERS IV SOLN: INTRAVENOUS | Status: DC

## 2021-08-07 MED ORDER — BUPIVACAINE HCL (PF) 0.25 % IJ SOLN
INTRAMUSCULAR | Status: DC | PRN
  Administered 2021-08-07: 6 mL

## 2021-08-07 MED ORDER — DEXAMETHASONE SODIUM PHOSPHATE 10 MG/ML IJ SOLN
INTRAMUSCULAR | Status: DC | PRN
  Administered 2021-08-07: 4 mg via INTRAVENOUS

## 2021-08-07 MED ORDER — OXYCODONE HCL 5 MG PO TABS: 5.0000 mg | ORAL_TABLET | Freq: Once | ORAL | Status: DC | PRN

## 2021-08-07 MED ORDER — KETOROLAC TROMETHAMINE 30 MG/ML IJ SOLN: 30.0000 mg | Freq: Once | INTRAMUSCULAR | Status: DC

## 2021-08-07 MED ORDER — OXYCODONE HCL 5 MG/5ML PO SOLN: 5.0000 mg | Freq: Once | ORAL | Status: DC | PRN

## 2021-08-07 MED ORDER — BUPIVACAINE HCL (PF) 0.25 % IJ SOLN
INTRAMUSCULAR | Status: AC
  Filled 2021-08-07: qty 30

## 2021-08-07 MED ORDER — AMISULPRIDE (ANTIEMETIC) 5 MG/2ML IV SOLN: 10.0000 mg | Freq: Once | INTRAVENOUS | Status: DC | PRN

## 2021-08-07 MED ORDER — FENTANYL CITRATE (PF) 100 MCG/2ML IJ SOLN
INTRAMUSCULAR | Status: DC | PRN
  Administered 2021-08-07 (×2): 50 ug via INTRAVENOUS

## 2021-08-07 MED ORDER — ONDANSETRON HCL 4 MG/2ML IJ SOLN
INTRAMUSCULAR | Status: DC | PRN
Start: 2021-08-07 — End: 2021-08-07
  Administered 2021-08-07: 4 mg via INTRAVENOUS

## 2021-08-07 SURGICAL SUPPLY — 47 items
APL PRP STRL LF DISP 70% ISPRP (MISCELLANEOUS) ×1
APL SKNCLS STERI-STRIP NONHPOA (GAUZE/BANDAGES/DRESSINGS)
BENZOIN TINCTURE PRP APPL 2/3 (GAUZE/BANDAGES/DRESSINGS) IMPLANT
BLADE MINI RND TIP GREEN BEAV (BLADE) IMPLANT
BLADE SURG 15 STRL LF DISP TIS (BLADE) ×2 IMPLANT
BLADE SURG 15 STRL SS (BLADE) ×4
BNDG CMPR 9X4 STRL LF SNTH (GAUZE/BANDAGES/DRESSINGS)
BNDG ELASTIC 2X5.8 VLCR STR LF (GAUZE/BANDAGES/DRESSINGS) IMPLANT
BNDG ELASTIC 3X5.8 VLCR STR LF (GAUZE/BANDAGES/DRESSINGS) ×2 IMPLANT
BNDG ESMARK 4X9 LF (GAUZE/BANDAGES/DRESSINGS) IMPLANT
BNDG GAUZE ELAST 4 BULKY (GAUZE/BANDAGES/DRESSINGS) ×2 IMPLANT
CHLORAPREP W/TINT 26 (MISCELLANEOUS) ×2 IMPLANT
CORD BIPOLAR FORCEPS 12FT (ELECTRODE) ×2 IMPLANT
COVER BACK TABLE 60X90IN (DRAPES) ×2 IMPLANT
COVER MAYO STAND STRL (DRAPES) ×2 IMPLANT
CUFF TOURN SGL QUICK 18X4 (TOURNIQUET CUFF) ×2 IMPLANT
DRAPE EXTREMITY T 121X128X90 (DISPOSABLE) ×2 IMPLANT
DRAPE SURG 17X23 STRL (DRAPES) ×2 IMPLANT
DRSG PAD ABDOMINAL 8X10 ST (GAUZE/BANDAGES/DRESSINGS) IMPLANT
GAUZE SPONGE 4X4 12PLY STRL (GAUZE/BANDAGES/DRESSINGS) ×2 IMPLANT
GAUZE XEROFORM 1X8 LF (GAUZE/BANDAGES/DRESSINGS) ×2 IMPLANT
GLOVE BIO SURGEON STRL SZ7.5 (GLOVE) ×2 IMPLANT
GLOVE BIOGEL PI IND STRL 8 (GLOVE) ×1 IMPLANT
GLOVE BIOGEL PI INDICATOR 8 (GLOVE) ×1
GOWN STRL REUS W/ TWL LRG LVL3 (GOWN DISPOSABLE) ×1 IMPLANT
GOWN STRL REUS W/TWL LRG LVL3 (GOWN DISPOSABLE) ×2
GOWN STRL REUS W/TWL XL LVL3 (GOWN DISPOSABLE) ×2 IMPLANT
NDL HYPO 25X1 1.5 SAFETY (NEEDLE) IMPLANT
NEEDLE HYPO 25X1 1.5 SAFETY (NEEDLE) IMPLANT
NS IRRIG 1000ML POUR BTL (IV SOLUTION) ×2 IMPLANT
PACK BASIN DAY SURGERY FS (CUSTOM PROCEDURE TRAY) ×2 IMPLANT
PAD CAST 3X4 CTTN HI CHSV (CAST SUPPLIES) IMPLANT
PADDING CAST ABS 4INX4YD NS (CAST SUPPLIES) ×1
PADDING CAST ABS COTTON 4X4 ST (CAST SUPPLIES) ×1 IMPLANT
PADDING CAST COTTON 3X4 STRL (CAST SUPPLIES)
SPLINT PLASTER CAST XFAST 3X15 (CAST SUPPLIES) IMPLANT
SPLINT PLASTER XTRA FASTSET 3X (CAST SUPPLIES)
STOCKINETTE 4X48 STRL (DRAPES) ×2 IMPLANT
STRIP CLOSURE SKIN 1/2X4 (GAUZE/BANDAGES/DRESSINGS) IMPLANT
SUT ETHILON 4 0 PS 2 18 (SUTURE) ×1 IMPLANT
SUT MNCRL AB 4-0 PS2 18 (SUTURE) IMPLANT
SUT MON AB 5-0 PS2 18 (SUTURE) IMPLANT
SUT VIC AB 4-0 P2 18 (SUTURE) IMPLANT
SYR BULB EAR ULCER 3OZ GRN STR (SYRINGE) ×2 IMPLANT
SYR CONTROL 10ML LL (SYRINGE) IMPLANT
TOWEL GREEN STERILE FF (TOWEL DISPOSABLE) ×4 IMPLANT
UNDERPAD 30X36 HEAVY ABSORB (UNDERPADS AND DIAPERS) ×2 IMPLANT

## 2021-08-07 NOTE — H&P (Signed)
?  Justin Hodge is an 41 y.o. male.   ?Chief Complaint: right wrist mass ?HPI: 41 yo male with mass at ulnar side right wrist.  It is bothersome to him.  He wishes to have it removed. ? ?Allergies:  ?Allergies  ?Allergen Reactions  ? Shellfish Allergy Anaphylaxis and Rash  ?  Swelling, throat closes, has epipens ?Swelling, throat closes, has epipens  ? Other   ?  Other reaction(s): Rhinitis (Runny Nose, Stuffy Nose, Sneezing)  ? Latex Rash  ? ? ?Past Medical History:  ?Diagnosis Date  ? Asthma   ? Bipolar 1 disorder, depressed ([place])   ? Borderline personality disorder ([place])   ? Hepatitis C   ? s/p treatment 2018  ? HIV (human immunodeficiency virus infection) ([place])   ? ? ?Past Surgical History:  ?Procedure Laterality Date  ? Bile obstruction    ? Temporary Colostomy s/p reversal  ? ? ?Family History: ?Family History  ?Problem Relation Age of Onset  ? Depression Mother   ? Alcohol abuse Father   ? Drug abuse Father   ? Alcohol abuse Brother   ? Alcohol abuse Maternal Aunt   ? Alcohol abuse Maternal Aunt   ? Alcohol abuse Maternal Uncle   ? Alcohol abuse Maternal Grandfather   ? Depression Maternal Grandmother   ? Schizophrenia Cousin   ? Suicidality Cousin   ? ? ?Social History:  ? reports that he has quit smoking. His smoking use included e-cigarettes and cigarettes. He has never been exposed to tobacco smoke. He has never used smokeless tobacco. He reports that he does not currently use alcohol. He reports current drug use. Frequency: 7.00 times per week. Drug: Marijuana. ? ?Medications: ?Medications Prior to Admission  ?Medication Sig Dispense Refill  ? BIKTARVY 50-200-25 MG TABS tablet Take 1 tablet by mouth daily. 30 tablet 4  ? buPROPion (WELLBUTRIN XL) 150 MG 24 hr tablet Take 150 mg by mouth daily.    ? lithium 600 MG capsule Take 600 mg by mouth 2 (two) times daily.    ? omeprazole (PRILOSEC) 40 MG capsule Take 1 capsule by mouth daily.    ? EPINEPHrine 0.3 mg/0.3 mL IJ SOAJ injection     ? ? ?No results  found for this or any previous visit (from the past 48 hour(s)). ? ?No results found. ? ? ? ?Blood pressure 121/70, pulse (!) 47, temperature 97.7 ?F (36.5 ?C), temperature source Oral, resp. rate 17, height '5\' 11"'$  (1.803 m), weight 81.4 kg, SpO2 100 %. ? ?General appearance: alert, cooperative, and appears stated age ?Head: Normocephalic, without obvious abnormality, atraumatic ?Neck: supple, symmetrical, trachea midline ?Extremities: Intact sensation and capillary refill all digits.  +epl/fpl/io.  No wounds.  ?Pulses: 2+ and symmetric ?Skin: Skin color, texture, turgor normal. No rashes or lesions ?Neurologic: Grossly normal ?Incision/Wound: none ? ?Assessment/Plan ?Right wrist mass. Non operative and operative treatment options have been discussed with the patient and patient wishes to proceed with operative treatment. Risks, benefits, and alternatives of surgery have been discussed and the patient agrees with the plan of care.  ? ?Leanora Cover ?08/07/2021, 12:57 PM ? ?

## 2021-08-07 NOTE — Anesthesia Preprocedure Evaluation (Addendum)
Anesthesia Evaluation  ?Patient identified by MRN, date of birth, ID band ?Patient awake ? ? ? ?Reviewed: ?Allergy & Precautions, NPO status , Patient's Chart, lab work & pertinent test results ? ?Airway ?Mallampati: I ? ?TM Distance: >3 FB ?Neck ROM: Full ? ? ? Dental ?no notable dental hx. ? ?  ?Pulmonary ?asthma , Patient abstained from smoking., former smoker,  ?  ?Pulmonary exam normal ? ? ? ? ? ? ? Cardiovascular ?negative cardio ROS ?Normal cardiovascular exam ? ? ?  ?Neuro/Psych ?PSYCHIATRIC DISORDERS Anxiety Bipolar Disorder negative neurological ROS ?   ? GI/Hepatic ?negative GI ROS, (+)  ?  ? substance abuse ? , Hepatitis - (treated ), C  ?Endo/Other  ?negative endocrine ROS ? Renal/GU ?negative Renal ROS  ? ?  ?Musculoskeletal ?negative musculoskeletal ROS ?(+)  ? Abdominal ?  ?Peds ? Hematology ? ?(+) HIV,   ?Anesthesia Other Findings ?MASS RIGHT WRIST VOLAR ASPECT ? Reproductive/Obstetrics ? ?  ? ? ? ? ? ? ? ? ? ? ? ? ? ?  ?  ? ? ? ? ? ? ? ?Anesthesia Physical ?Anesthesia Plan ? ?ASA: 3 ? ?Anesthesia Plan: General  ? ?Post-op Pain Management:   ? ?Induction: Intravenous ? ?PONV Risk Score and Plan: 2 and Ondansetron, Dexamethasone, Midazolam and Treatment may vary due to age or medical condition ? ?Airway Management Planned: LMA ? ?Additional Equipment:  ? ?Intra-op Plan:  ? ?Post-operative Plan: Extubation in OR ? ?Informed Consent: I have reviewed the patients History and Physical, chart, labs and discussed the procedure including the risks, benefits and alternatives for the proposed anesthesia with the patient or authorized representative who has indicated his/her understanding and acceptance.  ? ? ? ? ? ?Plan Discussed with: CRNA ? ?Anesthesia Plan Comments:   ? ? ? ? ? ?Anesthesia Quick Evaluation ? ?

## 2021-08-07 NOTE — Anesthesia Postprocedure Evaluation (Signed)
Anesthesia Post Note ? ?Patient: Justin Hodge ? ?Procedure(s) Performed: EXCISION MASS ULNAR ASPECT RIGHT WRIST (Right: Wrist) ? ?  ? ?Patient location during evaluation: PACU ?Anesthesia Type: General ?Level of consciousness: awake ?Pain management: pain level controlled ?Vital Signs Assessment: post-procedure vital signs reviewed and stable ?Respiratory status: spontaneous breathing, nonlabored ventilation, respiratory function stable and patient connected to nasal cannula oxygen ?Cardiovascular status: blood pressure returned to baseline and stable ?Postop Assessment: no apparent nausea or vomiting ?Anesthetic complications: no ? ? ?No notable events documented. ? ?Last Vitals:  ?Vitals:  ? 08/07/21 1415 08/07/21 1428  ?BP: 116/75 103/72  ?Pulse: (!) 36 (!) 50  ?Resp: 12 20  ?Temp:  (!) 36.3 ?C  ?SpO2: 98% 98%  ?  ?Last Pain:  ?Vitals:  ? 08/07/21 1428  ?TempSrc: Oral  ?PainSc:   ? ? ?  ?  ?  ?  ?  ?  ? ?Sani Loiseau P Terrah Decoster ? ? ? ? ?

## 2021-08-07 NOTE — Op Note (Signed)
NAME: Justin Hodge ?MEDICAL RECORD NO: [id number] ?DATE OF BIRTH: [birth year] ?FACILITY: [facility] ?LOCATION: [facility] ?PHYSICIAN: Tennis Must, MD ?  ?OPERATIVE REPORT ?  ?DATE OF PROCEDURE: 08/07/21  ?  ?PREOPERATIVE DIAGNOSIS: Right wrist mass ?  ?POSTOPERATIVE DIAGNOSIS: Right wrist mass ?  ?PROCEDURE:  Excision right wrist mass, subcutaneous tissues, 7 mm ?  ?SURGEON:  Leanora Cover, M.D. ?  ?ASSISTANT: none ?  ?ANESTHESIA:  General ?  ?INTRAVENOUS FLUIDS:  Per anesthesia flow sheet. ?  ?ESTIMATED BLOOD LOSS:  Minimal. ?  ?COMPLICATIONS:  None. ?  ?SPECIMENS: Right wrist mass to pathology ?  ?TOURNIQUET TIME:   ? ?Total Tourniquet Time Documented: ?Forearm (Right) - 9 minutes ?Total: Forearm (Right) - 9 minutes ? ?  ?DISPOSITION:  Stable to PACU. ?  ?INDICATIONS: 41 year old male with mass in the ulnar side of his right wrist.  This is bothersome to him.  He wishes to have it removed.  Risks, benefits and alternatives of surgery were discussed including the risks of blood loss, infection, damage to nerves, vessels, tendons, ligaments, bone for surgery, need for additional surgery, complications with wound healing, continued pain, stiffness, , recurrence.  He voiced understanding of these risks and elected to proceed. ? ?OPERATIVE COURSE:  After being identified preoperatively by myself,  the patient and I agreed on the procedure and site of the procedure.  The surgical site was marked.  Surgical consent had been signed. Preoperative IV antibiotic prophylaxis was given. He was transferred to the operating room and placed on the operating table in supine position with the Right upper extremity on an arm board.  General anesthesia was induced by the anesthesiologist.  Right upper extremity was prepped and draped in normal sterile orthopedic fashion.  A surgical pause was performed between the surgeons, anesthesia, and operating room staff and all were in agreement as to the patient, procedure,  and site of procedure.  Tourniquet at the proximal aspect of the forearm was inflated to 250 mmHg after exsanguination of the arm with an Esmarch bandage.  Incision was made over the mass at the radial side of the wrist.  This is carried into subcutaneous tissues by spreading technique.  Mass was easily identified.  Was carefully freed up from soft tissue attachments.  It shelled out easily.  It was solid.  There was no stalk.  It measured 7 mm in diameter.  Was sent to pathology for examination.  The area was inspected and no remaining mass was noted.  The wound was copiously irrigated with sterile saline.  Was closed with 4-0 nylon in a horizontal mattress fashion.  It was then injected with quarter percent plain Marcaine to aid in postoperative analgesia.  Was dressed with sterile Xeroform 4 x 4 and ABD used as a splint.  This was wrapped with Kerlix and Ace bandage.  The tourniquet was deflated at 9 minutes.  Fingertips were pink with brisk capillary refill after deflation of tourniquet.  The operative  drapes were broken down.  The patient was awoken from anesthesia safely.  He was transferred back to the stretcher and taken to PACU in stable condition.  I will see him back in the office in 1 week for postoperative followup.  I will give him a prescription for Norco 5/325 1-2 tabs PO q6 hours prn pain, dispense # 15. ? ? ?Leanora Cover, MD ?Electronically signed, 08/07/21 ?

## 2021-08-07 NOTE — Transfer of Care (Signed)
Immediate Anesthesia Transfer of Care Note ? ?Patient: Chosen Geske ? ?Procedure(s) Performed: EXCISION MASS ULNAR ASPECT RIGHT WRIST (Right: Wrist) ? ?Patient Location: PACU ? ?Anesthesia Type:General ? ?Level of Consciousness: drowsy and patient cooperative ? ?Airway & Oxygen Therapy: Patient Spontanous Breathing and Patient connected to face mask oxygen ? ?Post-op Assessment: Report given to RN and Post -op Vital signs reviewed and stable ? ?Post vital signs: Reviewed and stable ? ?Last Vitals:  ?Vitals Value Taken Time  ?BP    ?Temp    ?Pulse 47 08/07/21 1337  ?Resp    ?SpO2 98 % 08/07/21 1337  ?Vitals shown include unvalidated device data. ? ?Last Pain:  ?Vitals:  ? 08/07/21 1153  ?TempSrc: Oral  ?PainSc: 0-No pain  ?   ? ?Patients Stated Pain Goal: 6 (08/07/21 1153) ? ?Complications: No notable events documented. ?

## 2021-08-07 NOTE — Discharge Instructions (Addendum)

## 2021-08-07 NOTE — Anesthesia Procedure Notes (Signed)
Procedure Name: LMA Insertion ?Date/Time: 08/07/2021 1:12 PM ?Performed by: Reagan Behlke, Ernesta Amble, CRNA ?Pre-anesthesia Checklist: Patient identified, Emergency Drugs available, Suction available and Patient being monitored ?Patient Re-evaluated:Patient Re-evaluated prior to induction ?Oxygen Delivery Method: Circle System Utilized ?Preoxygenation: Pre-oxygenation with 100% oxygen ?Induction Type: IV induction ?Ventilation: Mask ventilation without difficulty ?LMA: LMA inserted ?LMA Size: 4.0 ?Number of attempts: 1 ?Airway Equipment and Method: bite block ?Placement Confirmation: positive ETCO2 ?Tube secured with: Tape ?Dental Injury: Teeth and Oropharynx as per pre-operative assessment  ? ? ? ? ?

## 2021-08-08 ENCOUNTER — Encounter (HOSPITAL_BASED_OUTPATIENT_CLINIC_OR_DEPARTMENT_OTHER): Payer: Self-pay | Admitting: Orthopedic Surgery

## 2021-08-08 LAB — SURGICAL PATHOLOGY

## 2021-08-18 ENCOUNTER — Ambulatory Visit (INDEPENDENT_AMBULATORY_CARE_PROVIDER_SITE_OTHER): Payer: Medicare Other | Admitting: Family

## 2021-08-18 ENCOUNTER — Encounter: Payer: Self-pay | Admitting: Family

## 2021-08-18 ENCOUNTER — Other Ambulatory Visit: Payer: Self-pay

## 2021-08-18 VITALS — BP 118/76 | HR 63 | Temp 97.0°F | Resp 16 | Ht 71.0 in | Wt 186.0 lb

## 2021-08-18 DIAGNOSIS — R39198 Other difficulties with micturition: Secondary | ICD-10-CM | POA: Diagnosis not present

## 2021-08-18 DIAGNOSIS — R110 Nausea: Secondary | ICD-10-CM | POA: Diagnosis not present

## 2021-08-18 DIAGNOSIS — Z0000 Encounter for general adult medical examination without abnormal findings: Secondary | ICD-10-CM | POA: Diagnosis not present

## 2021-08-18 DIAGNOSIS — Z21 Asymptomatic human immunodeficiency virus [HIV] infection status: Secondary | ICD-10-CM | POA: Diagnosis present

## 2021-08-18 DIAGNOSIS — N429 Disorder of prostate, unspecified: Secondary | ICD-10-CM | POA: Diagnosis not present

## 2021-08-18 MED ORDER — ONDANSETRON 4 MG PO TBDP: 4.0000 mg | ORAL_TABLET | Freq: Three times a day (TID) | ORAL | 0 refills | Status: AC | PRN

## 2021-08-18 MED ORDER — BIKTARVY 50-200-25 MG PO TABS: 1.0000 | ORAL_TABLET | Freq: Every day | ORAL | 4 refills | Status: AC

## 2021-08-18 NOTE — Assessment & Plan Note (Signed)
Justin Hodge continues to have well controlled virus although has had nausea which has affected his ability to take his medication regularly. Discussed importance of not taking medication intermittently. Check lab work today. Continue current dose of Biktarvy. Plan for follow up in 1 month or sooner if needed.  ?

## 2021-08-18 NOTE — Assessment & Plan Note (Signed)
Justin Hodge has difficulty with urination in combination with changes of flow in urine concerning for possible prostate origin. Will check PSA and UA. Not consistent with any signs of infection. Refer to Urology for further evaluation and treatment.  ?

## 2021-08-18 NOTE — Assessment & Plan Note (Signed)
Justin Hodge has nausea and occasional vomiting of unclear source as this started with Wellbutrin. Could be lingering effects. He does continue to smoke marijuana. Will treat with Zofran as needed for now to see if symptoms improve. If continues to have nausea may warrant further investigation. Follow up if symptoms worsen or do not improve.  ?

## 2021-08-18 NOTE — Patient Instructions (Addendum)
Nice to see you. ? ?We will check your lab work today. ? ?Continue to take your medication daily as prescribed. ? ?A referral to Urology has been placed and if you do not hear anything please let us know.  ? ?Refills have been sent to the pharmacy. ? ?Plan for follow up in 1 months or sooner if needed with lab work on the same day. ? ?Have a great day and stay safe! ? ?

## 2021-08-18 NOTE — Progress Notes (Signed)
? ? ?Brief Narrative  ? ?Patient ID: Justin Hodge, male    DOB: 10-Apr-1981, 41 y.o.   MRN: [id number] ? ?Justin Hodge is a 41 year old mixed race male diagnosed with HIV in 2001 with risk factor of MSM.  Initial viral load and CD4 count are unavailable.  Initial clinic lab work with CD4 count of 499 and viral load undetectable.  No genotype performed.  Has had history of thrush with no other significant opportunistic infections.  Previous history of hepatitis C status posttreatment. [id number] negative.  Previous medication regimen include Atripla, Tivicay, Truvada, and Descovy. ? ?Subjective:  ?  ?Chief Complaint  ?Patient presents with  ? Follow-up  ?  Nausea: Started taking Wellbutrin a month ago for depressiona nd assistance to stop vaping-and upon smelling boyfriends smoke causes nausea so he stopped taking meds 2 weeks ago and nausea is still present with no changes. Has had some vomiting and loss of appetite and missed doses of Biktarvy on bad nausea days.  ?Diifculty Urinating: Can not urinate in AM it is difficult. 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  ?  ? ? ?HPI: ? ?Justin Hodge is a 41 y.o. male with HIV disease last seen on 03/27/21 with good adherence and tolerance to [employer]. Viral load was undetectable and CD4 count 617. Kidney function, liver function and electrolytes within normal ranges. Here today for follow up.  ? ?Justin Hodge has been having increased levels of nausea starting about 1 month ago that has led to missing doses of Biktarvy. Nausea began following start of Wellbutrin for tobacco cessation. Noted that he would get nauseated by any smoke or vaping. Stopped taking Wellbutrin about 2 weeks ago and has continued nausea with occasional vomiting. Severity is enough to effect his appetite which has also decreased. Symptoms are generally worse in the morning and has not been eating breakfast. Symptoms have stayed  about the same since initial onset and refractory to gingerroot.  ? ?Justin Hodge also has concerns about difficulty urinating in the mornings with starting a stream of urine. Has interrupted streams at times. Concerns for possible prostate inflammation as this happened when he was using crystal meth and has noted it also happens when he takes his allergy medication which he is not currently taking. Does continue to vape at times and is using marijuana. Flow/stream of urine has changed. Does have family history of prostate cancer in grandfather and uncle.  ? ?Justin Hodge has no problems obtaining medication from the pharmacy. Denies feelings of being down, depressed, or hopeless. Continues to vape with no tobacco use or recreational or illicit drug use. Condoms offered.  ? ? ?Allergies  ?Allergen Reactions  ? Shellfish Allergy Anaphylaxis and Rash  ?  Swelling, throat closes, has epipens ?Swelling, throat closes, has epipens  ? Other   ?  Other reaction(s): Rhinitis (Runny Nose, Stuffy Nose, Sneezing)  ? Latex Rash  ? ? ? ? ?Outpatient Medications Prior to Visit  ?Medication Sig Dispense Refill  ? EPINEPHrine 0.3 mg/0.3 mL IJ SOAJ injection     ? lithium 600 MG capsule Take 600 mg by mouth 2 (two) times daily.    ? omeprazole (PRILOSEC) 40 MG capsule Take 1 capsule by mouth daily.    ? BIKTARVY 50-200-25 MG TABS tablet Take 1 tablet by mouth daily. 30 tablet 4  ? buPROPion (WELLBUTRIN XL) 150 MG 24 hr tablet Take 150 mg by mouth daily. (Patient  not taking: Reported on 08/18/2021)    ? HYDROcodone-acetaminophen (NORCO/VICODIN) 5-325 MG tablet 1-2 tabs PO q6 hours prn pain (Patient not taking: Reported on 08/18/2021) 15 tablet 0  ? ?No facility-administered medications prior to visit.  ? ? ? ?Past Medical History:  ?Diagnosis Date  ? Asthma   ? Bipolar 1 disorder, depressed ([place])   ? Borderline personality disorder ([place])   ? Hepatitis C   ? s/p treatment 2018  ? HIV (human immunodeficiency virus infection) ([place])    ? ? ? ?Past Surgical History:  ?Procedure Laterality Date  ? Bile obstruction    ? Temporary Colostomy s/p reversal  ? GANGLION CYST EXCISION Right 08/07/2021  ? Procedure: EXCISION MASS ULNAR ASPECT RIGHT WRIST;  Surgeon: Leanora Cover, MD;  Location: [facility];  Service: Orthopedics;  Laterality: Right;  45 MIN  ? ? ? ? ?Review of Systems  ?Constitutional:  Negative for appetite change, chills, fatigue, fever and unexpected weight change.  ?Eyes:  Negative for visual disturbance.  ?Respiratory:  Negative for cough, chest tightness, shortness of breath and wheezing.   ?Cardiovascular:  Negative for chest pain and leg swelling.  ?Gastrointestinal:  Negative for abdominal pain, constipation, diarrhea, nausea and vomiting.  ?Genitourinary:  Positive for difficulty urinating. Negative for dysuria, flank pain, frequency, genital sores, hematuria, penile discharge, penile pain, scrotal swelling and urgency.  ?Skin:  Negative for rash.  ?Allergic/Immunologic: Negative for immunocompromised state.  ?Neurological:  Negative for dizziness and headaches.  ?   ?Objective:  ?  ?BP 118/76   Pulse 63   Temp (!) 97 ?F (36.1 ?C)   Resp 16   Ht '5\' 11"'$  (1.803 m)   Wt 186 lb (84.4 kg)   SpO2 98%   BMI 25.94 kg/m?  ?Nursing note and vital signs reviewed. ? ?Physical Exam ?Constitutional:   ?   General: He is not in acute distress. ?   Appearance: He is well-developed.  ?Eyes:  ?   Conjunctiva/sclera: Conjunctivae normal.  ?Cardiovascular:  ?   Rate and Rhythm: Normal rate and regular rhythm.  ?   Heart sounds: Normal heart sounds. No murmur heard. ?  No friction rub. No gallop.  ?Pulmonary:  ?   Effort: Pulmonary effort is normal. No respiratory distress.  ?   Breath sounds: Normal breath sounds. No wheezing or rales.  ?Chest:  ?   Chest wall: No tenderness.  ?Abdominal:  ?   General: Bowel sounds are normal.  ?   Palpations: Abdomen is soft.  ?   Tenderness: There is no abdominal tenderness.  ?Musculoskeletal:  ?    Cervical back: Neck supple.  ?Lymphadenopathy:  ?   Cervical: No cervical adenopathy.  ?Skin: ?   General: Skin is warm and dry.  ?   Findings: No rash.  ?Neurological:  ?   Mental Status: He is alert and oriented to person, place, and time.  ?Psychiatric:     ?   Mood and Affect: Mood normal.  ? ? ? ? ?  08/18/2021  ?  8:58 AM 06/05/2021  ?  1:37 PM 03/27/2021  ?  2:14 PM 01/30/2021  ?  2:00 PM  ?Depression screen PHQ 2/9  ?Decreased Interest 0  0 1  ?Down, Depressed, Hopeless 0  0 1  ?PHQ - 2 Score 0  0 2  ?Altered sleeping 0   1  ?Tired, decreased energy 0   1  ?Change in appetite 3   1  ?Feeling bad or  failure about yourself  0   1  ?Trouble concentrating 0   0  ?Moving slowly or fidgety/restless 0   0  ?Suicidal thoughts 0   0  ?PHQ-9 Score 3   6  ?Difficult doing work/chores Not difficult at all     ?  ? Information is confidential and restricted. Go to Review Flowsheets to unlock data.  ?  ?   ?Assessment & Plan:  ? ? ?Patient Active Problem List  ? Diagnosis Date Noted  ? Difficulty urinating 08/18/2021  ? Nausea 08/18/2021  ? Asthma 06/05/2021  ? Bipolar 1 disorder, depressed, mild ([place]) 06/05/2021  ? GAD (generalized anxiety disorder) 06/05/2021  ? History of borderline personality disorder 06/05/2021  ? At risk for prolonged QT interval syndrome 06/05/2021  ? High risk medication use 06/05/2021  ? Healthcare maintenance 03/27/2021  ? Penile discharge 02/02/2021  ? Ganglion cyst 01/30/2021  ? Bipolar 1 disorder, mixed ([place]) 01/30/2021  ? Encounter for sperm count for fertility testing 01/30/2021  ? Gastroesophageal reflux disease without esophagitis 10/12/2019  ? Ganglion cyst of finger of right hand 10/12/2019  ? Wrist lump, right 09/24/2019  ? Tobacco use 01/29/2019  ? 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 HIV (human immunodeficiency virus infection) ([place]) 04/24/1999  ? ? ? ?Problem List Items Addressed This Visit   ? ?  ? Other  ? HIV (human immunodeficiency virus infection) ([place]) - Primary  ?  Mr.  Hodge continues to have well controlled virus although has had nausea which has affected his ability to take his medication regularly. Discussed importance of not taking medication intermittently. Check lab work t

## 2021-08-18 NOTE — Assessment & Plan Note (Signed)
?   Discussed importance of condom use and safe sexual practice. Condoms offered.  ?? Will complete routine dental care independently and can refer to [facility] clinic as needed.  ?

## 2021-08-22 LAB — COMPREHENSIVE METABOLIC PANEL
AG Ratio: 1.4 (calc) (ref 1.0–2.5)
ALT: 43 U/L (ref 9–46)
AST: 24 U/L (ref 10–40)
Albumin: 4.2 g/dL (ref 3.6–5.1)
Alkaline phosphatase (APISO): 48 U/L (ref 36–130)
BUN: 15 mg/dL (ref 7–25)
CO2: 25 mmol/L (ref 20–32)
Calcium: 9.4 mg/dL (ref 8.6–10.3)
Chloride: 106 mmol/L (ref 98–110)
Creat: 1.11 mg/dL (ref 0.60–1.29)
Globulin: 2.9 g/dL (calc) (ref 1.9–3.7)
Glucose, Bld: 90 mg/dL (ref 65–99)
Potassium: 4.3 mmol/L (ref 3.5–5.3)
Sodium: 139 mmol/L (ref 135–146)
Total Bilirubin: 0.4 mg/dL (ref 0.2–1.2)
Total Protein: 7.1 g/dL (ref 6.1–8.1)

## 2021-08-22 LAB — URINALYSIS, ROUTINE W REFLEX MICROSCOPIC
Bilirubin Urine: NEGATIVE
Glucose, UA: NEGATIVE
Hgb urine dipstick: NEGATIVE
Ketones, ur: NEGATIVE
Leukocytes,Ua: NEGATIVE
Nitrite: NEGATIVE
Protein, ur: NEGATIVE
Specific Gravity, Urine: 1.017 (ref 1.001–1.035)
pH: 6.5 (ref 5.0–8.0)

## 2021-08-22 LAB — PSA: PSA: 0.42 ng/mL (ref <=4.00)

## 2021-08-22 LAB — T-HELPER CELLS (CD4) COUNT (NOT AT ARMC)
Absolute CD4: 744 cells/uL (ref 490–1740)
CD4 T Helper %: 29 % — ABNORMAL LOW (ref 30–61)
Total lymphocyte count: 2593 cells/uL (ref 850–3900)

## 2021-08-22 LAB — HIV-1 RNA QUANT-NO REFLEX-BLD
HIV 1 RNA Quant: NOT DETECTED copies/mL
HIV-1 RNA Quant, Log: NOT DETECTED Log copies/mL

## 2021-08-30 ENCOUNTER — Ambulatory Visit (INDEPENDENT_AMBULATORY_CARE_PROVIDER_SITE_OTHER): Payer: Medicare Other | Admitting: Urology

## 2021-08-30 ENCOUNTER — Encounter: Payer: Self-pay | Admitting: Urology

## 2021-08-30 VITALS — BP 134/76 | HR 73 | Ht 71.0 in | Wt 181.0 lb

## 2021-08-30 DIAGNOSIS — R3911 Hesitancy of micturition: Secondary | ICD-10-CM | POA: Diagnosis not present

## 2021-08-30 DIAGNOSIS — R39198 Other difficulties with micturition: Secondary | ICD-10-CM

## 2021-08-30 LAB — URINALYSIS, ROUTINE W REFLEX MICROSCOPIC
Bilirubin, UA: NEGATIVE
Glucose, UA: NEGATIVE
Ketones, UA: NEGATIVE
Leukocytes,UA: NEGATIVE
Nitrite, UA: NEGATIVE
Protein,UA: NEGATIVE
Specific Gravity, UA: 1.025 (ref 1.005–1.030)
Urobilinogen, Ur: 0.2 mg/dL (ref 0.2–1.0)
pH, UA: 6 (ref 5.0–7.5)

## 2021-08-30 LAB — MICROSCOPIC EXAMINATION
Bacteria, UA: NONE SEEN
WBC, UA: NONE SEEN /hpf (ref 0–5)

## 2021-08-30 LAB — BLADDER SCAN AMB NON-IMAGING: Scan Result: 0

## 2021-08-30 NOTE — Progress Notes (Signed)
post void residual =0mL 

## 2021-08-30 NOTE — Progress Notes (Signed)
? ?Assessment: ?1. Urinary hesitancy   ? ? ?Plan: ?Recommend taking allergy medication without decongestant. ?Increase fluid intake ?Patient reassured. ?Return to office prn ? ?Chief Complaint:  ?Chief Complaint  ?Patient presents with  ? urinary hesitancy  ? ? ?History of Present Illness: ? ?Justin Hodge is a 41 y.o. year old male who is seen in consultation from Mauricio Po, [place] for evaluation of lower urinary tract symptoms.  He reports symptoms of hesitancy, decreased force of stream, and intermittent stream for approximately 1 month.  His symptoms are more noticeable in the morning and improves throughout the day.  He has had symptoms of frequency and nocturia x 2 for some time.  No dysuria or gross hematuria.  No history of prostatitis or UTIs.  He notices his symptoms after taking Allegra D.   ? ?He reports a family history of prostate cancer with a paternal grandfather and paternal uncle. ?PSA from 08/18/2021: 0.42 ? ?He is also concerned about his fertility status.  He reports feeling a "lump" in the scrotum approximately 10 years ago.  He was told at that time that he may have issues with fertility.  He has not had a semen analysis done.  He is not actively trying to conceive. ? ?He previously used steroids for weight gain.  He discontinued the steroids in 12/22. ? ? ?Past Medical History:  ?Past Medical History:  ?Diagnosis Date  ? Asthma   ? Bipolar 1 disorder, depressed ([place])   ? Borderline personality disorder ([place])   ? Hepatitis C   ? s/p treatment 2018  ? HIV (human immunodeficiency virus infection) ([place])   ? ? ?Past Surgical History:  ?Past Surgical History:  ?Procedure Laterality Date  ? Bile obstruction    ? Temporary Colostomy s/p reversal  ? GANGLION CYST EXCISION Right 08/07/2021  ? Procedure: EXCISION MASS ULNAR ASPECT RIGHT WRIST;  Surgeon: Leanora Cover, MD;  Location: [facility];  Service: Orthopedics;  Laterality: Right;  45 MIN  ? ? ?Allergies:  ?Allergies  ?Allergen  Reactions  ? Shellfish Allergy Anaphylaxis and Rash  ?  Swelling, throat closes, has epipens ?Swelling, throat closes, has epipens  ? Other   ?  Other reaction(s): Rhinitis (Runny Nose, Stuffy Nose, Sneezing)  ? Latex Rash  ? ? ?Family History:  ?Family History  ?Problem Relation Age of Onset  ? Depression Mother   ? Alcohol abuse Father   ? Drug abuse Father   ? Alcohol abuse Brother   ? Alcohol abuse Maternal Aunt   ? Alcohol abuse Maternal Aunt   ? Alcohol abuse Maternal Uncle   ? Alcohol abuse Maternal Grandfather   ? Depression Maternal Grandmother   ? Schizophrenia Cousin   ? Suicidality Cousin   ? ? ?Social History:  ?Social History  ? ?Tobacco Use  ? Smoking status: Every Day  ?  Types: E-cigarettes, Cigarettes  ?  Start date: 04/23/1996  ?  Last attempt to quit: 06/21/2020  ?  Years since quitting: 1.1  ?  Passive exposure: Never  ? Smokeless tobacco: Never  ?Vaping Use  ? Vaping Use: Every day  ? Start date: 06/21/2020  ? Substances: Nicotine  ? Devices: [employer]  ?Substance Use Topics  ? Alcohol use: Not Currently  ?  Comment: Social  ? Drug use: Yes  ?  Frequency: 7.0 times per week  ?  Types: Marijuana  ? ? ?Review of symptoms:  ?Constitutional:  Negative for unexplained weight loss, night sweats, fever,  chills ?ENT:  Negative for nose bleeds, sinus pain, painful swallowing ?CV:  Negative for chest pain, shortness of breath, exercise intolerance, palpitations, loss of consciousness ?Resp:  Negative for cough, wheezing, shortness of breath ?GI:  Negative for nausea, vomiting, diarrhea, bloody stools ?GU:  Positives noted in HPI; otherwise negative for gross hematuria, dysuria, urinary incontinence ?Neuro:  Negative for seizures, poor balance, limb weakness, slurred speech ?Psych:  Negative for lack of energy, depression, anxiety ?Endocrine:  Negative for polydipsia, polyuria, symptoms of hypoglycemia (dizziness, hunger, sweating) ?Hematologic:  Negative for anemia, purpura, petechia, prolonged or excessive  bleeding, use of anticoagulants  ?Allergic:  Negative for difficulty breathing or choking as a result of exposure to anything; no shellfish allergy; no allergic response (rash/itch) to materials, foods ? ?Physical exam: ?BP 134/76   Pulse 73   Ht '5\' 11"'$  (1.803 m)   Wt 181 lb (82.1 kg)   BMI 25.24 kg/m?  ?GENERAL APPEARANCE:  Well appearing, well developed, well nourished, NAD ?HEENT: Atraumatic, Normocephalic, oropharynx clear. ?NECK: Supple without lymphadenopathy or thyromegaly. ?LUNGS: Clear to auscultation bilaterally. ?HEART: Regular Rate and Rhythm without murmurs, gallops, or rubs. ?ABDOMEN: Soft, non-tender, No Masses. ?EXTREMITIES: Moves all extremities well.  Without clubbing, cyanosis, or edema. ?NEUROLOGIC:  Alert and oriented x 3, normal gait, CN II-XII grossly intact.  ?MENTAL STATUS:  Appropriate. ?BACK:  Non-tender to palpation.  No CVAT ?SKIN:  Warm, dry and intact.   ?GU: ?Penis:  circumcised ?Meatus: Normal ?Scrotum: No erythema or edema; vasa palpated bilaterally ?Testis: normal without masses bilateral ?Epididymis: normal ?Prostate: 20 g, nontender, no nodules ?Rectum: Normal tone,  no masses or tenderness ? ? ?Results: ?U/A:  0-2 RBCs ? ?Results for orders placed or performed in visit on 08/30/21 (from the past 24 hour(s))  ?BLADDER SCAN AMB NON-IMAGING  ? Collection Time: 08/30/21  1:49 PM  ?Result Value Ref Range  ? Scan Result 0   ? ? ?

## 2021-09-15 ENCOUNTER — Ambulatory Visit: Payer: Medicare Other | Admitting: Family

## 2021-12-28 ENCOUNTER — Encounter (HOSPITAL_BASED_OUTPATIENT_CLINIC_OR_DEPARTMENT_OTHER): Payer: Medicare Other

## 2021-12-28 DIAGNOSIS — Z7689 Persons encountering health services in other specified circumstances: Secondary | ICD-10-CM

## 2021-12-29 MED ORDER — VALACYCLOVIR HCL 1 G PO TABS: 1000.0000 mg | ORAL_TABLET | Freq: Two times a day (BID) | ORAL | 0 refills | Status: AC

## 2021-12-29 NOTE — Telephone Encounter (Signed)

## 2021-12-29 NOTE — Addendum Note (Signed)
Addended by: Mauricio Po D on: 12/29/2021 09:39 AM   Modules accepted: Orders

## 2023-06-10 IMAGING — US US EXTREM UP *R* LTD
2 series · 14 of 23 positions shown · non-contrast
Comparison: None.

CLINICAL DATA: Chronic ulnar-sided wrist mass, present for many
years.

EXAM:
ULTRASOUND RIGHT UPPER EXTREMITY LIMITED
TECHNIQUE: Ultrasound examination of the upper extremity soft tissues was
performed in the area of clinical concern.

[Series 1: us extrem up *right* ltd · 0.04mm/px · 15 acquisitions, 9 frames shown (1 of 2)]
[im 1/15]
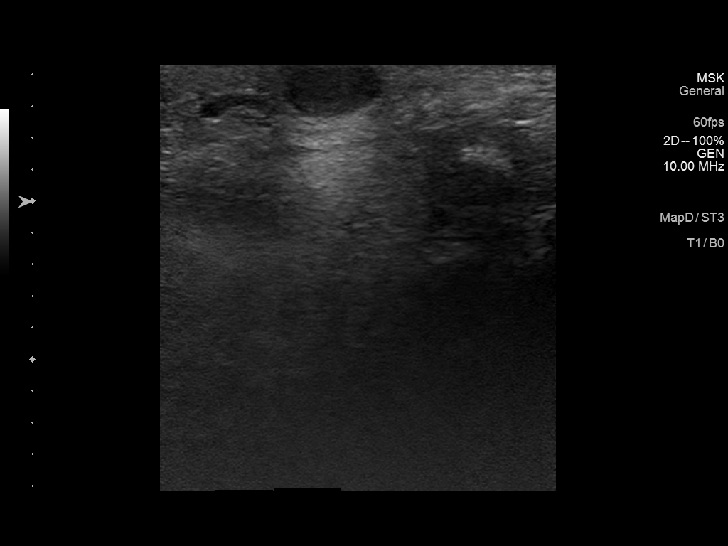
[im 3/15]
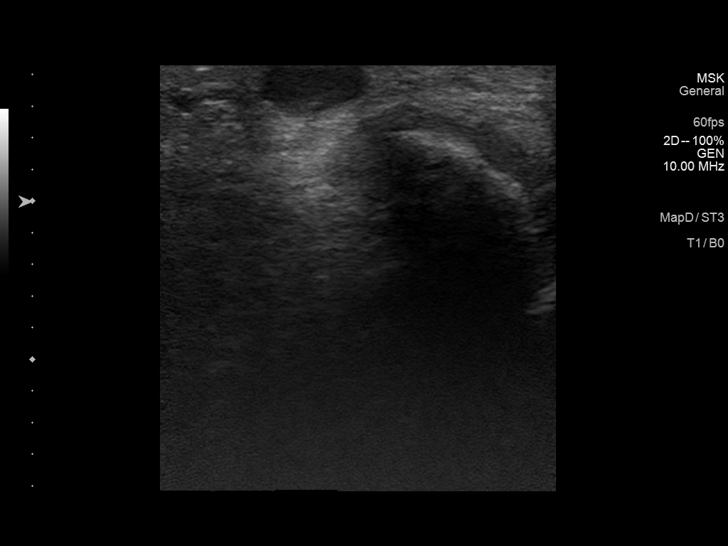
[im 5/15]
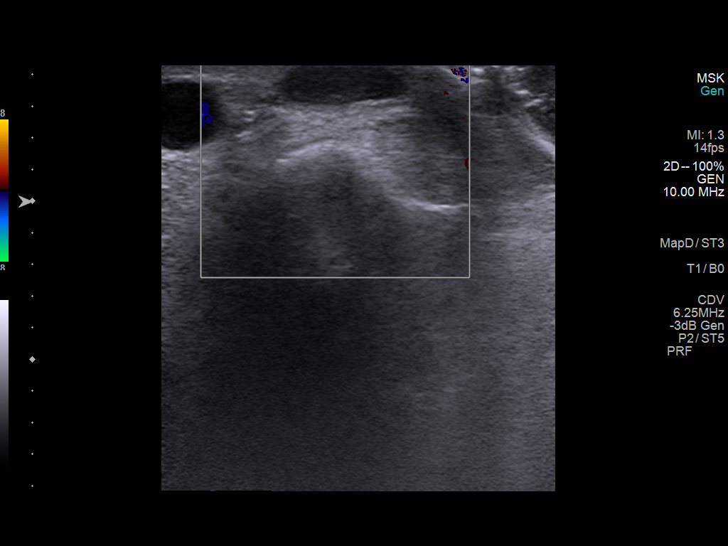
[im 6/15]
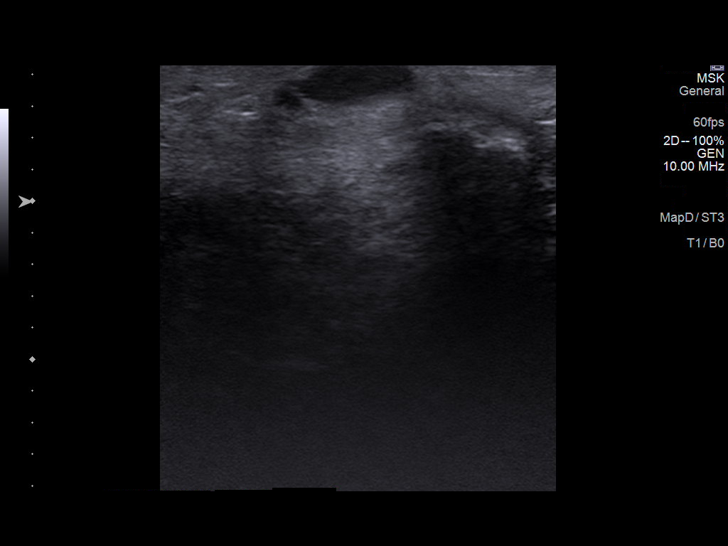
[im 8/15]
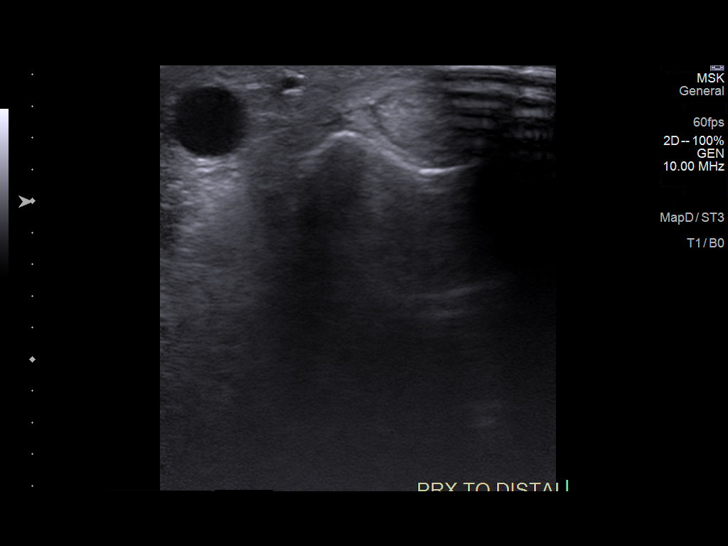
[im 10/15]
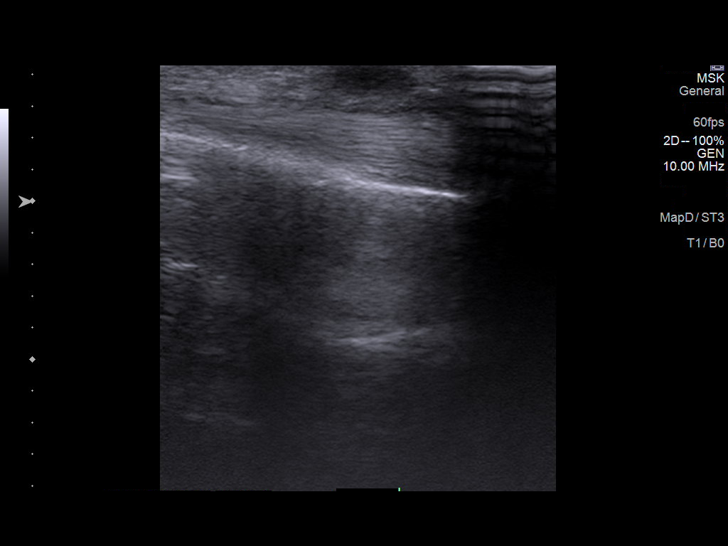
[im 11/15]
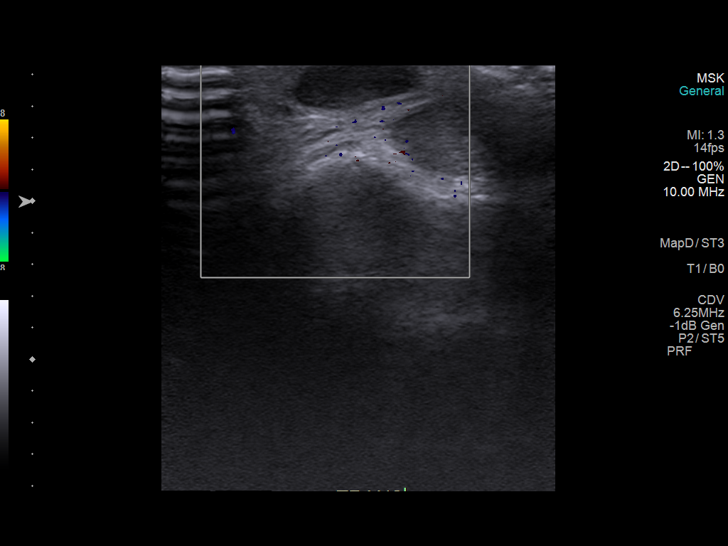
[im 13/15]
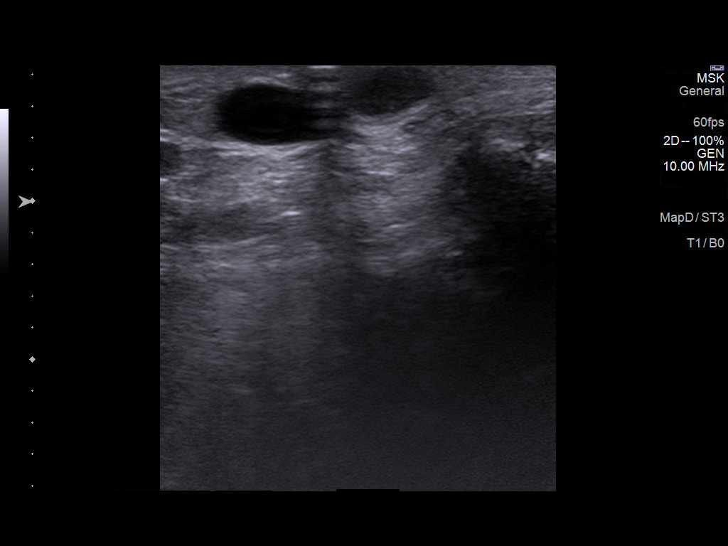
[im 14/15]
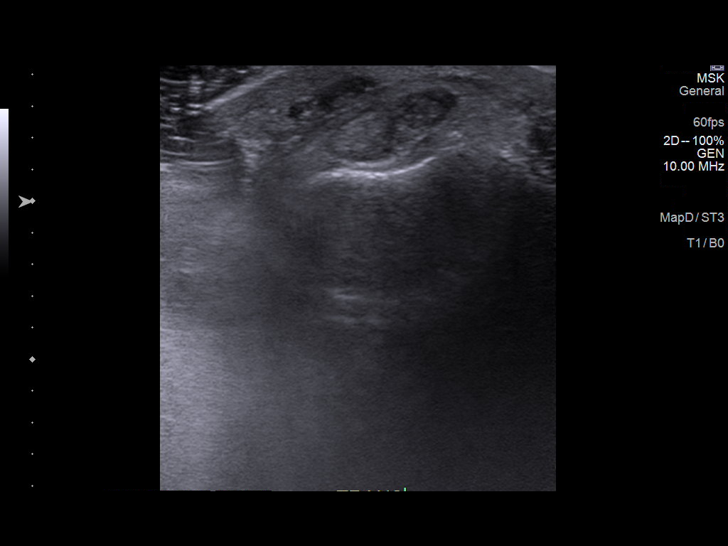

[Series 2: us extrem up *right* ltd · 0.04mm/px · 5 of 8 slices shown (2 of 2)]
[im 1/8]
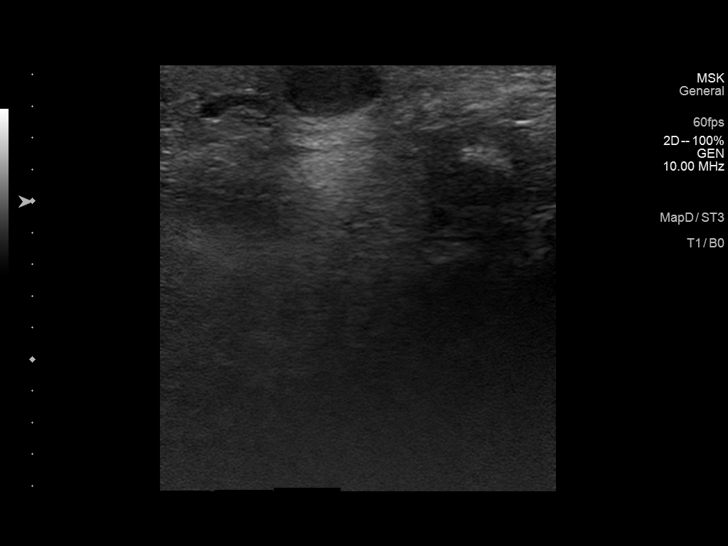
[im 3/8]
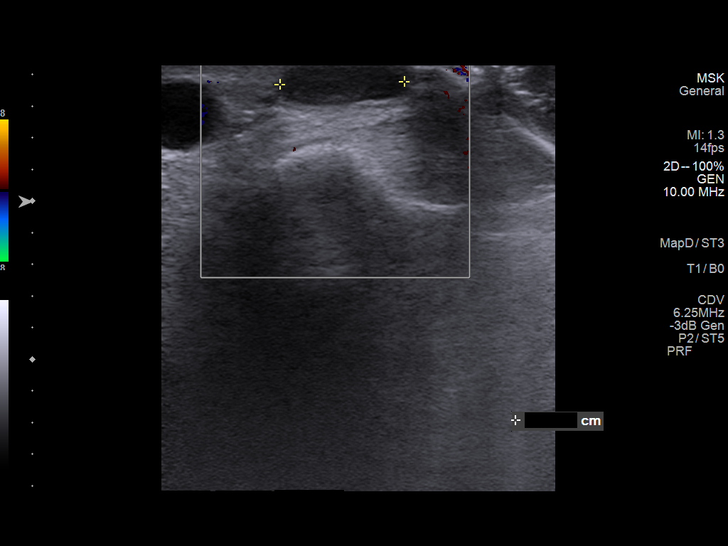
[im 4/8]
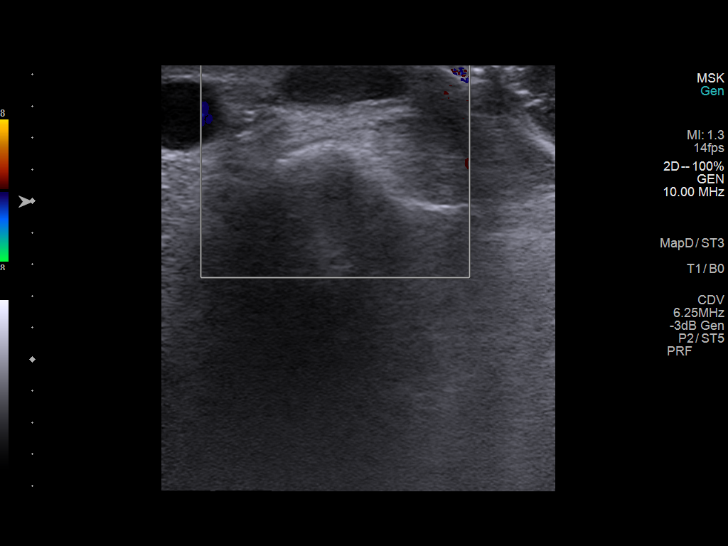
[im 6/8]
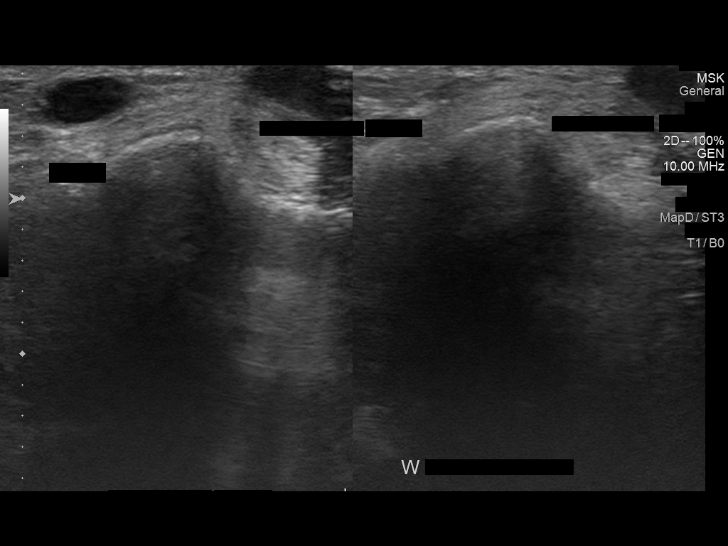
[im 8/8]
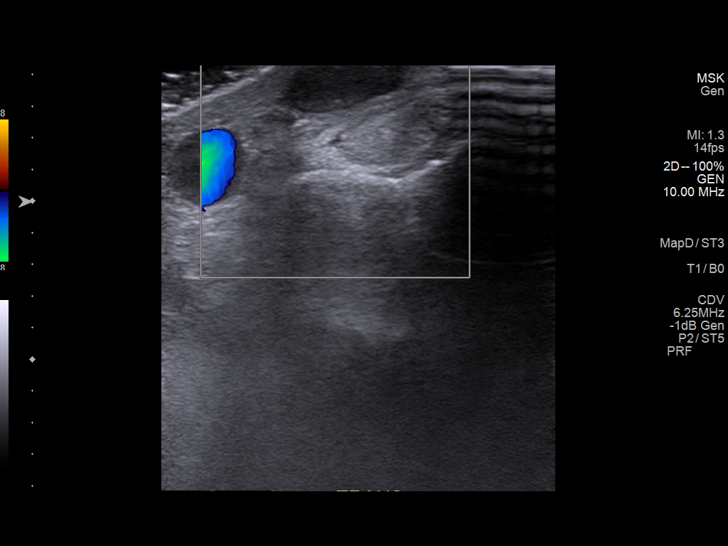

[14 of 23 positions shown; findings below may reference images not displayed]

FINDINGS: Focused ultrasound of the palpable abnormality along the ulnar
aspect of the right wrist demonstrates a oval well-defined
hypoechoic lesion with posterior acoustic enhancement measuring 7 x
4 x 8 mm. There is no internal vascularity. The mass is
superficially located in between the extensor carpi ulnaris tendon
and ulnar vessels.
IMPRESSION: 1. Indeterminate 8 mm superficial mass along the ulnar aspect of the
wrist between the extensor carpi ulnaris tendon and ulnar vessels,
not clearly a ganglion cyst. No worrisome sonographic features.
